# Patient Record
Sex: Male | Born: 1960 | Race: White | Hispanic: No | Smoking: Never smoker
Health system: Southern US, Community
[De-identification: ages and names within clinical notes are randomized; demographics above are authoritative.]

## PROBLEM LIST (undated history)

## (undated) DIAGNOSIS — K862 Cyst of pancreas: Secondary | ICD-10-CM

## (undated) DIAGNOSIS — E079 Disorder of thyroid, unspecified: Secondary | ICD-10-CM

## (undated) DIAGNOSIS — E7800 Pure hypercholesterolemia, unspecified: Secondary | ICD-10-CM

## (undated) DIAGNOSIS — K219 Gastro-esophageal reflux disease without esophagitis: Secondary | ICD-10-CM

## (undated) DIAGNOSIS — E039 Hypothyroidism, unspecified: Secondary | ICD-10-CM

## (undated) HISTORY — DX: Pure hypercholesterolemia, unspecified: E78.00

## (undated) HISTORY — PX: EYE SURGERY: SHX253

## (undated) HISTORY — DX: Disorder of thyroid, unspecified: E07.9

## (undated) HISTORY — PX: OTHER SURGICAL HISTORY: SHX169

---

## 2002-06-08 ENCOUNTER — Encounter: Payer: Self-pay | Admitting: Family Medicine

## 2002-06-08 ENCOUNTER — Encounter: Admission: RE | Admit: 2002-06-08 | Discharge: 2002-06-08 | Payer: Self-pay | Admitting: Family Medicine

## 2013-07-23 ENCOUNTER — Ambulatory Visit
Admission: RE | Admit: 2013-07-23 | Discharge: 2013-07-23 | Disposition: A | Discharge status: Home / Self Care | Payer: No Typology Code available for payment source | Source: Ambulatory Visit | Attending: Physician Assistant | Admitting: Physician Assistant

## 2013-07-23 ENCOUNTER — Encounter (INDEPENDENT_AMBULATORY_CARE_PROVIDER_SITE_OTHER): Payer: Self-pay

## 2013-07-23 ENCOUNTER — Other Ambulatory Visit: Payer: Self-pay | Admitting: Physician Assistant

## 2013-07-23 DIAGNOSIS — R059 Cough, unspecified: Secondary | ICD-10-CM

## 2013-07-23 DIAGNOSIS — R05 Cough: Secondary | ICD-10-CM

## 2013-07-28 ENCOUNTER — Other Ambulatory Visit: Payer: Self-pay | Admitting: Physician Assistant

## 2013-07-28 DIAGNOSIS — R911 Solitary pulmonary nodule: Secondary | ICD-10-CM

## 2013-07-29 ENCOUNTER — Ambulatory Visit
Admission: RE | Admit: 2013-07-29 | Discharge: 2013-07-29 | Disposition: A | Discharge status: Home / Self Care | Payer: No Typology Code available for payment source | Source: Ambulatory Visit | Attending: Physician Assistant | Admitting: Physician Assistant

## 2013-07-29 DIAGNOSIS — R911 Solitary pulmonary nodule: Secondary | ICD-10-CM

## 2013-07-29 MED ORDER — IOHEXOL 300 MG/ML  SOLN
75.0000 mL | Freq: Once | INTRAMUSCULAR | Status: AC | PRN
  Administered 2013-07-29: 75 mL via INTRAVENOUS

## 2013-08-02 ENCOUNTER — Other Ambulatory Visit: Payer: Self-pay | Admitting: Family Medicine

## 2013-08-02 DIAGNOSIS — K769 Liver disease, unspecified: Secondary | ICD-10-CM

## 2013-08-02 DIAGNOSIS — K869 Disease of pancreas, unspecified: Secondary | ICD-10-CM

## 2013-08-07 ENCOUNTER — Ambulatory Visit
Admission: RE | Admit: 2013-08-07 | Discharge: 2013-08-07 | Disposition: A | Discharge status: Home / Self Care | Payer: No Typology Code available for payment source | Source: Ambulatory Visit | Attending: Family Medicine | Admitting: Family Medicine

## 2013-08-07 DIAGNOSIS — K769 Liver disease, unspecified: Secondary | ICD-10-CM

## 2013-08-07 DIAGNOSIS — K869 Disease of pancreas, unspecified: Secondary | ICD-10-CM

## 2013-08-07 MED ORDER — GADOBENATE DIMEGLUMINE 529 MG/ML IV SOLN
20.0000 mL | Freq: Once | INTRAVENOUS | Status: AC | PRN
  Administered 2013-08-07: 20 mL via INTRAVENOUS

## 2013-08-25 ENCOUNTER — Encounter (HOSPITAL_COMMUNITY): Payer: Self-pay | Admitting: *Deleted

## 2013-08-25 ENCOUNTER — Encounter (HOSPITAL_COMMUNITY): Payer: Self-pay | Admitting: Pharmacy Technician

## 2013-08-30 ENCOUNTER — Other Ambulatory Visit: Payer: Self-pay | Admitting: Gastroenterology

## 2013-08-30 NOTE — Addendum Note (Signed)
Addended by: Chamille Werntz on: 08/30/2013 12:51 PM   Modules accepted: Orders  

## 2013-09-01 ENCOUNTER — Encounter (HOSPITAL_COMMUNITY): Payer: Self-pay | Admitting: *Deleted

## 2013-09-01 ENCOUNTER — Encounter (HOSPITAL_COMMUNITY)
Admission: RE | Disposition: A | Discharge status: Home / Self Care | Payer: Self-pay | Source: Ambulatory Visit | Attending: Gastroenterology

## 2013-09-01 ENCOUNTER — Ambulatory Visit (HOSPITAL_COMMUNITY): Payer: No Typology Code available for payment source | Admitting: Anesthesiology

## 2013-09-01 ENCOUNTER — Encounter (HOSPITAL_COMMUNITY): Payer: No Typology Code available for payment source | Admitting: Anesthesiology

## 2013-09-01 ENCOUNTER — Ambulatory Visit (HOSPITAL_COMMUNITY)
Admission: RE | Admit: 2013-09-01 | Discharge: 2013-09-01 | Disposition: A | Discharge status: Home / Self Care | Payer: No Typology Code available for payment source | Source: Ambulatory Visit | Attending: Gastroenterology | Admitting: Gastroenterology

## 2013-09-01 DIAGNOSIS — Z7982 Long term (current) use of aspirin: Secondary | ICD-10-CM | POA: Diagnosis not present

## 2013-09-01 DIAGNOSIS — E039 Hypothyroidism, unspecified: Secondary | ICD-10-CM | POA: Insufficient documentation

## 2013-09-01 DIAGNOSIS — Z79899 Other long term (current) drug therapy: Secondary | ICD-10-CM | POA: Insufficient documentation

## 2013-09-01 DIAGNOSIS — K863 Pseudocyst of pancreas: Principal | ICD-10-CM

## 2013-09-01 DIAGNOSIS — E7800 Pure hypercholesterolemia, unspecified: Secondary | ICD-10-CM | POA: Diagnosis not present

## 2013-09-01 DIAGNOSIS — K862 Cyst of pancreas: Secondary | ICD-10-CM | POA: Diagnosis present

## 2013-09-01 HISTORY — PX: EUS: SHX5427

## 2013-09-01 HISTORY — DX: Gastro-esophageal reflux disease without esophagitis: K21.9

## 2013-09-01 HISTORY — DX: Hypothyroidism, unspecified: E03.9

## 2013-09-01 HISTORY — PX: FINE NEEDLE ASPIRATION: SHX5430

## 2013-09-01 LAB — PANC CYST FLD ANLYS-PATHFNDR-TG

## 2013-09-01 SURGERY — ESOPHAGEAL ENDOSCOPIC ULTRASOUND (EUS) RADIAL
Anesthesia: Monitor Anesthesia Care

## 2013-09-01 MED ORDER — PROPOFOL 10 MG/ML IV BOLUS
INTRAVENOUS | Status: AC
  Filled 2013-09-01: qty 20

## 2013-09-01 MED ORDER — CIPROFLOXACIN HCL 500 MG PO TABS: 500.0000 mg | ORAL_TABLET | Freq: Two times a day (BID) | ORAL | Status: DC

## 2013-09-01 MED ORDER — LIDOCAINE HCL (CARDIAC) 20 MG/ML IV SOLN
INTRAVENOUS | Status: DC | PRN
  Administered 2013-09-01: 50 mg via INTRAVENOUS

## 2013-09-01 MED ORDER — SODIUM CHLORIDE 0.9 % IV SOLN: INTRAVENOUS | Status: DC

## 2013-09-01 MED ORDER — LIDOCAINE HCL (CARDIAC) 20 MG/ML IV SOLN
INTRAVENOUS | Status: AC
  Filled 2013-09-01: qty 5

## 2013-09-01 MED ORDER — CIPROFLOXACIN IN D5W 400 MG/200ML IV SOLN
400.0000 mg | Freq: Once | INTRAVENOUS | Status: AC
  Administered 2013-09-01: 400 mg via INTRAVENOUS

## 2013-09-01 MED ORDER — LACTATED RINGERS IV SOLN
INTRAVENOUS | Status: DC
  Administered 2013-09-01: 1000 mL via INTRAVENOUS

## 2013-09-01 MED ORDER — PROPOFOL INFUSION 10 MG/ML OPTIME
INTRAVENOUS | Status: DC | PRN
  Administered 2013-09-01: 120 ug/kg/min via INTRAVENOUS

## 2013-09-01 MED ORDER — CIPROFLOXACIN IN D5W 400 MG/200ML IV SOLN
INTRAVENOUS | Status: AC
  Filled 2013-09-01: qty 200

## 2013-09-01 MED ORDER — PROPOFOL 10 MG/ML IV BOLUS
INTRAVENOUS | Status: DC | PRN
  Administered 2013-09-01: 60 mg via INTRAVENOUS

## 2013-09-01 NOTE — Op Note (Signed)
[facility] [address] [place], [id number]   ENDOSCOPIC ULTRASOUND PROCEDURE REPORT  PATIENT: Tyler Smith, Tyler L.  MR#: [id number] BIRTHDATE: [birth year]  GENDER: Male ENDOSCOPIST: Willis ModenaWilliam Shacora Zynda, MD REFERRED BY:  Lupe Carneyean Mitchell, M.D. PROCEDURE DATE:  09/01/2013 PROCEDURE:   Upper EUS w/FNA ASA CLASS:      Class II INDICATIONS:   1.  pancreatic cyst. MEDICATIONS: MAC sedation, administered by CRNA, ciprofloxacin 400 mg IV  DESCRIPTION OF PROCEDURE:   After the risks benefits and alternatives of the procedure were  explained, informed consent was obtained. The patient was then placed in the left, lateral, decubitus postion and IV sedation was administered. Throughout the procedure, the patients blood pressure, pulse and oxygen saturations were monitored continuously.  Under direct visualization, the Pentax EUS Linear [id number]  endoscope was introduced through the mouth  and advanced to the second portion of the duodenum .  Water was used as necessary to provide an acoustic interface.  Upon completion of the imaging, water was removed and the patient was sent to the recovery room in satisfactory condition.    FINDINGS:      Normal head, neck, and body of pancreas.  Arising from anterior portion of the pancreatic tail, essentially in between the spleen and the left kidney, a 3 x 2 cm lesion was seen. It is round and well-defined.  It is mixed solid and cystic, but more solid than cystic.  No clear septations were seen.  No perilesional adenopathy was noted.  No mural nodularity was noted. No obvious communication with an otherwise normal-appearing and non-dilated pancreatic duct.  Lesion was biopsied x 5 (25g x 3, 22g x 3) and a separate pass was sent for cyst fluid analysis.  Bile duct non-dilated and otherwise normal.  Gallbladder appeared normal.  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 Differential considerations include mucinous cystic  neoplasm (side branch IPMN, mucinous cystadenoma) versus islet cell tumor. Overall imaging characteristics not typical of adenocarcinoma.  RECOMMENDATIONS:     1.  Watch for potential complications of procedure. 2.  Ciprofloxacin 500 mg po bid x 5 days. 3.  Awaiting cyst fluid and cytology results. 4.  Consider surgical consultation for consideration of distal pancreatectomy and splenectomy, pending cytology and cyst fluid results. 5.  Follow-up with [facility] on as-needed basis.   _______________________________ Rosalie DoctoreSignedWillis Modena:  Yuritzi Kamp, MD 09/01/2013 1:20 PM   CC:

## 2013-09-01 NOTE — Transfer of Care (Signed)
Immediate Anesthesia Transfer of Care Note  Patient: Tyler Smith  Procedure(s) Performed: Procedure(s): ESOPHAGEAL ENDOSCOPIC ULTRASOUND (EUS) RADIAL (N/A) FINE NEEDLE ASPIRATION (FNA) LINEAR (N/A)  Patient Location: PACU  Anesthesia Type:MAC  Level of Consciousness: awake, alert  and oriented  Airway & Oxygen Therapy: Patient Spontanous Breathing and Patient connected to nasal cannula oxygen  Post-op Assessment: Report given to PACU RN and Post -op Vital signs reviewed and stable  Post vital signs: Reviewed and stable  Complications: No apparent anesthesia complications

## 2013-09-01 NOTE — Anesthesia Preprocedure Evaluation (Addendum)
Anesthesia Evaluation  Patient identified by MRN, date of birth, ID band Patient awake    Reviewed: Allergy & Precautions, H&P , NPO status , Patient's Chart, lab work & pertinent test results  Airway Mallampati: II TM Distance: >3 FB Neck ROM: Full    Dental no notable dental hx.    Pulmonary neg pulmonary ROS,  breath sounds clear to auscultation  Pulmonary exam normal       Cardiovascular negative cardio ROS  Rhythm:Regular Rate:Normal     Neuro/Psych negative neurological ROS  negative psych ROS   GI/Hepatic negative GI ROS, Neg liver ROS,   Endo/Other  Hypothyroidism   Renal/GU negative Renal ROS  negative genitourinary   Musculoskeletal negative musculoskeletal ROS (+)   Abdominal   Peds negative pediatric ROS (+)  Hematology negative hematology ROS (+)   Anesthesia Other Findings   Reproductive/Obstetrics negative OB ROS                           Anesthesia Physical Anesthesia Plan  ASA: II  Anesthesia Plan: MAC   Post-op Pain Management:    Induction:   Airway Management Planned: Natural Airway  Additional Equipment:   Intra-op Plan:   Post-operative Plan:   Informed Consent: I have reviewed the patients History and Physical, chart, labs and discussed the procedure including the risks, benefits and alternatives for the proposed anesthesia with the patient or authorized representative who has indicated his/her understanding and acceptance.   Dental advisory given  Plan Discussed with: CRNA  Anesthesia Plan Comments:         Anesthesia Quick Evaluation

## 2013-09-01 NOTE — Discharge Instructions (Signed)
Endoscopic Ultrasound ° °Care After °Please read the instructions outlined below and refer to this sheet in the next few weeks. These discharge instructions provide you with general information on caring for yourself after you leave the hospital. Your doctor may also give you specific instructions. While your treatment has been planned according to the most current medical practices available, unavoidable complications occasionally occur. If you have any problems or questions after discharge, please call Dr. Jonathon Castelo ([facility]) at [phone]. ° °HOME CARE INSTRUCTIONS °Activity °· You may resume your regular activity but move at a slower pace for the next 24 hours.  °· Take frequent rest periods for the next 24 hours.  °· Walking will help expel (get rid of) the air and reduce the bloated feeling in your abdomen.  °· No driving for 24 hours (because of the anesthesia (medicine) used during the test).  °· You may shower.  °· Do not sign any important legal documents or operate any machinery for 24 hours (because of the anesthesia used during the test).  °Nutrition °· Drink plenty of fluids.  °· You may resume your normal diet.  °· Begin with a light meal and progress to your normal diet.  °· Avoid alcoholic beverages for 24 hours or as instructed by your caregiver.  °Medications °You may resume your normal medications unless your caregiver tells you otherwise. °What you can expect today °· You may experience abdominal discomfort such as a feeling of fullness or "gas" pains.  °· You may experience a sore throat for 2 to 3 days. This is normal. Gargling with salt water may help this.  °·  °SEEK IMMEDIATE MEDICAL CARE IF: °· You have excessive nausea (feeling sick to your stomach) and/or vomiting.  °· You have severe abdominal pain and distention (swelling).  °· You have trouble swallowing.  °· You have a temperature over 100° F (37.8° C).  °· You have rectal bleeding or vomiting of blood.  °Document  Released: 09/12/2003 Document Revised: 10/10/2010 Document Reviewed: 03/25/2007 °ExitCare® Patient Information ©2012 [employer]. °

## 2013-09-01 NOTE — H&P (Signed)
Patient interval history reviewed.  Patient examined again.  There has been no change from documented H/P dated 08/20/13 (scanned into chart from our office) except as documented above.  Assessment:  1.  Pancreatic tail cyst.  Plan:  1.  Upper endoscopic ultrasound with possible fine needle aspiration +/- cyst fluid aspiration. 2.  Risks (bleeding, infection, bowel perforation that could require surgery, sedation-related changes in cardiopulmonary systems), benefits (identification and possible treatment of source of symptoms, exclusion of certain causes of symptoms), and alternatives (watchful waiting, radiographic imaging studies, empiric medical treatment) of upper endoscopy with ultrasound and possible cyst aspiration +/- fine needle aspiration biopsies (EUS +/- FNA) were explained to patient/family in detail and patient wishes to proceed.

## 2013-09-02 ENCOUNTER — Encounter (HOSPITAL_COMMUNITY): Payer: Self-pay | Admitting: Gastroenterology

## 2013-09-02 NOTE — Anesthesia Postprocedure Evaluation (Signed)
  Anesthesia Post-op Note  Patient: Haig ProphetJamie L Mischke  Procedure(s) Performed: Procedure(s) (LRB): ESOPHAGEAL ENDOSCOPIC ULTRASOUND (EUS) RADIAL (N/A) FINE NEEDLE ASPIRATION (FNA) LINEAR (N/A)  Patient Location: PACU  Anesthesia Type: MAC  Level of Consciousness: awake and alert   Airway and Oxygen Therapy: Patient Spontanous Breathing  Post-op Pain: mild  Post-op Assessment: Post-op Vital signs reviewed, Patient's Cardiovascular Status Stable, Respiratory Function Stable, Patent Airway and No signs of Nausea or vomiting  Last Vitals:  Filed Vitals:   09/01/13 1330  BP: 152/101  Pulse: 75  Temp: 36.8 C  Resp: 26    Post-op Vital Signs: stable   Complications: No apparent anesthesia complications

## 2013-09-27 ENCOUNTER — Ambulatory Visit (INDEPENDENT_AMBULATORY_CARE_PROVIDER_SITE_OTHER): Payer: No Typology Code available for payment source | Admitting: General Surgery

## 2013-09-27 ENCOUNTER — Encounter (INDEPENDENT_AMBULATORY_CARE_PROVIDER_SITE_OTHER): Payer: Self-pay | Admitting: General Surgery

## 2013-09-27 VITALS — BP 130/76 | HR 77 | Temp 98.7°F | Ht 71.0 in | Wt 230.0 lb

## 2013-09-27 DIAGNOSIS — K862 Cyst of pancreas: Secondary | ICD-10-CM | POA: Insufficient documentation

## 2013-09-27 DIAGNOSIS — K863 Pseudocyst of pancreas: Secondary | ICD-10-CM

## 2013-09-27 NOTE — Progress Notes (Addendum)
Chief Complaint  Patient presents with  . pancreatitis cyst    Referring MD: Dr. Arta Silence  HISTORY:  She is a 53 year old male who had an incidentally discovered pancreatic cyst mass. He is referred by Dr. Paulita Fujita for consultation regarding this mass. He had a chronic cough and underwent a chest x-ray. The chest x-ray was nonrevealing and a chest CT was ordered. This chest CT demonstrated a 3 cm lesion in the distal pancreas.  The patient subsequently underwent an endoscopic ultrasound. The cytology demonstrated some mucinous fluid and atypical cells on the FNA.  The patient has no symptoms related to the pancreas that he is aware of. He does sometimes have some distention. He denies any issues with his blood glucose, diarrhea, jaundice, pancreatitis, family history of pancreatitis or nausea and vomiting.  Of note, his mother did have breast cancer and brain cancer. She passed away at age 44. He and his wife are not sure if the brain cancer was primary or metastatic. His chronic cough has resolved.  Past Medical History  Diagnosis Date  . Thyroid disease   . Hypercholesterolemia   . GERD (gastroesophageal reflux disease)     none recent  . Hypothyroidism     Past Surgical History  Procedure Laterality Date  . Eye surgery Bilateral age 74    lazy eye  . Pinkie finger surgfery Right yrs ago  . Eus N/A 09/01/2013    Procedure: ESOPHAGEAL ENDOSCOPIC ULTRASOUND (EUS) RADIAL;  Surgeon: Arta Silence, MD;  Location: [facility];  Service: Endoscopy;  Laterality: N/A;  . Fine needle aspiration N/A 09/01/2013    Procedure: FINE NEEDLE ASPIRATION (FNA) LINEAR;  Surgeon: Arta Silence, MD;  Location: [facility];  Service: Endoscopy;  Laterality: N/A;    Current Outpatient Prescriptions  Medication Sig Dispense Refill  . aspirin EC 81 MG tablet Take 81 mg by mouth daily.      Marland Kitchen levothyroxine (SYNTHROID, LEVOTHROID) 100 MCG tablet Take 100 mcg by mouth daily before breakfast.      .  pravastatin (PRAVACHOL) 40 MG tablet Take 40 mg by mouth daily.       No current facility-administered medications for this visit.     Allergies  Allergen Reactions  . Penicillins Hives     Family History  Problem Relation Age of Onset  . Cancer - Other Mother   . COPD Father   . Coronary artery disease Brother   . Coronary artery disease Brother      History   Social History  . Marital Status: Unknown    Spouse Name: N/A    Number of Children: N/A  . Years of Education: N/A   Social History Main Topics  . Smoking status: Never Smoker   . Smokeless tobacco: Never Used  . Alcohol Use: Yes     Comment: occasional  . Drug Use: No  . Sexual Activity: None   Other Topics Concern  . None   Social History Narrative  . None     REVIEW OF SYSTEMS - PERTINENT POSITIVES ONLY: 12 point review of systems negative other than HPI and PMH except for bloating.    EXAM: Filed Vitals:   09/27/13 1101  BP: 130/76  Pulse: 77  Temp: 98.7 F (37.1 C)    Wt Readings from Last 3 Encounters:  09/27/13 230 lb (104.327 kg)  09/01/13 230 lb (104.327 kg)  09/01/13 230 lb (104.327 kg)     Gen:  No acute distress.  Well nourished and well  groomed.   Neurological: Alert and oriented to person, place, and time. Coordination normal.  Head: Normocephalic and atraumatic.  Eyes: Conjunctivae are normal. Pupils are equal, round, and reactive to light. No scleral icterus.  Neck: Normal range of motion. Neck supple. No tracheal deviation or thyromegaly present.  Cardiovascular: Normal rate, regular rhythm, normal heart sounds and intact distal pulses.  Exam reveals no gallop and no friction rub.  No murmur heard. Respiratory: Effort normal.  No respiratory distress. No chest wall tenderness. Breath sounds normal.  No wheezes, rales or rhonchi.  GI: Soft. Bowel sounds are normal. The abdomen is soft and nontender.  There is no rebound and no guarding.  Musculoskeletal: Normal range of  motion. Extremities are nontender.  Lymphadenopathy: No cervical, preauricular, postauricular or axillary adenopathy is present Skin: Skin is warm and dry. No rash noted. No diaphoresis. No erythema. No pallor. No clubbing, cyanosis, or edema.   Psychiatric: Normal mood and affect. Behavior is normal. Judgment and thought content normal.    LABORATORY RESULTS: Available labs are reviewed  Cytology Diagnosis FINE NEEDLE ASPIRATION: NEEDLE ASPIRATION: NEEDLE ASPIRATION, ENDOSCOPIC, PANCREAS TAIL(SPECIMEN 1 OF 1 COLLECTED 09/01/13): ATYPICAL CELLS PRESENT, SUSPICIOUS FOR MUCIN PRODUCING CYSTIC NEOPLASM SEE COMMENT. COMMENT: THE CASE WAS REVIEWED BY DR Avis Epley, WHO CONCURS.  Recent Results (from the past 2160 hour(s))  PANC CYST FLD ANLYS-PATHFNDR-TG     Status: None   Collection Time    09/01/13 12:30 PM      Result Value Ref Range   Pathfinder TG, Pancreatic Cyst Fluid SPECIMEN SENT TO [facility]       RADIOLOGY RESULTS: See E-Chart or I-Site for most recent results.  Images and reports are reviewed.  CT chest IMPRESSION:  No evidence of pulmonary mass/nodule ; preceding radiographic  finding likely represented a summation artifact.  Cystic mass at tail of pancreas 3.0 x 2.4 x 2.6 cm.  Questionable low-attenuation lesion at posterior RIGHT lobe liver  2.0 x 1.4 x 1.5 cm.  Further evaluation of these findings by MR imaging with and without  contrast recommended.    MRI Abdomen IMPRESSION: 1. Complex cystic lesion arising from the pancreatic tail. This has indeterminate imaging characteristics. Considerations include a pseudocyst complicated by prior hemorrhage versus a cystic neoplasm such as intraductal papillary mucinous tumor or less likely islet cell tumor. If the patient is a good endoscopy candidate, endoscopic ultrasound with aspiration should be considered. If not, follow-up with pre and post contrast abdominal MRI is recommended at 3- 6 months. 2. Two  right liver lobe hemangiomas    ASSESSMENT AND PLAN: Cystic mass of pancreas This mass has some concerning features including the mucin, the complex cystic/solid components, and the size.  His mother did have breast cancer and possibly a second cancer.  If the mass in the pancreas is malignant, he is would need to be checked for BRCA.    We will plan surgical excision.  I would plan on removing the distal pancreas robotically or laparoscopically.  I would assess the mass intraoperatively.  If it is extremely firm, I would send it for frozen section and take the spleen if malignant.      I discussed the surgery with diagrams of anatomy.  I reviewed the anticipated hospital stay, recovery time, and restrictions.  I discussed the most common complications including the ones below:  Bleeding Infection and possible wound complications such as hernia Damage to adjacent structures Leak of the pancreas (5-10%) Possible need for other procedures, such  as abscess drains in radiology or endoscopy.   Possible prolonged hospital stay Possible development of diabetes or worsening of current diabetes. (5-20%) Possible diarrhea from lack of pancreatic enzymes.   Prolonged fatigue/weakness/appetite MOST PATIENTS' ENERGY LEVEL IS NOT BACK TO NORMAL FOR AT LEAST 2-3 MONTHS.  OLDER PATIENTS MAY FEEL WEAK FOR LONGER PERIODS OF TIME.   Possible early recurrence of cancer Possible complications of your medical problems such as heart disease or arrhythmias. Death (less than 1%)  He understands and wishes to proceed.     40 min spent in evaluation, examination, counseling, and coordination of care.  >50% of time in counseling.     Milus Height MD Surgical Oncology, [facility]   ADDENDUM - PANCREATIC CYST FLUID CEA 56, 940 AMYLASE 3,574   Visit Diagnoses: 1. Cystic mass of pancreas     Primary Care Physician: Donnie Coffin, MD  Other care  team Arta Silence, MD

## 2013-09-27 NOTE — Assessment & Plan Note (Signed)
This mass has some concerning features including the mucin, the complex cystic/solid components, and the size.  His mother did have breast cancer and possibly a second cancer.  If the mass in the pancreas is malignant, he is would need to be checked for BRCA.    We will plan surgical excision.  I would plan on removing the distal pancreas robotically or laparoscopically.  I would assess the mass intraoperatively.  If it is extremely firm, I would send it for frozen section and take the spleen if malignant.      I discussed the surgery with diagrams of anatomy.  I reviewed the anticipated hospital stay, recovery time, and restrictions.  I discussed the most common complications including the ones below:  Bleeding Infection and possible wound complications such as hernia Damage to adjacent structures Leak of the pancreas (5-10%) Possible need for other procedures, such as abscess drains in radiology or endoscopy.   Possible prolonged hospital stay Possible development of diabetes or worsening of current diabetes. (5-20%) Possible diarrhea from lack of pancreatic enzymes.   Prolonged fatigue/weakness/appetite MOST PATIENTS' ENERGY LEVEL IS NOT BACK TO NORMAL FOR AT LEAST 2-3 MONTHS.  OLDER PATIENTS MAY FEEL WEAK FOR LONGER PERIODS OF TIME.   Possible early recurrence of cancer Possible complications of your medical problems such as heart disease or arrhythmias. Death (less than 1%)  He understands and wishes to proceed.

## 2013-09-27 NOTE — Patient Instructions (Signed)
Pancreas Surgery  THE OPERATION: The goal of the operation is to remove masses in the tail of the pancreas.  The distal pancreas and possibly the spleen will be removed.  The reason so much may need to be removed is that the blood supply and lymphatic channels and nodes are closely interconnected.  The operation takes between 2-4 hours depending on the amount of scar tissue you have present from prior surgeries, or from the mass.    WHAT HAPPENS IN THE OPERATING ROOM:  You will have 3-5 incisions in your abdomen You may have an incision in the midline around 3-4 inches for a hand port, and several other incisions in the left upper abdomen.  Whatever the operative findings, I will discuss the case with your family after we are done in the operating room.  I will talk to you in the next few days when you are more awake.  I will see you in the hospital every weekday that I am not out of town.  My partners help see patients on the weekends and if I am out of town.    WHAT HAPPENS AFTER SURGERY: After surgery, you will go first to the recovery room,then to an appropriate level of room.  You will have many tubes and lines in place which is standard for this operation.   You will have a tube in your nose for 1-2 days in order to suction out the stomach.  YOU WILL NOT BE ABLE TO EAT FOR SEVERAL DAYS AFTER SURGERY.  You will have a catheter in your bladder.  On your abdomen, you will have a surgical drain and possibly a pain pump with numbing medicine.  You will have compression stockings on your legs to decrease the risk of blood clots.    We will address your pain in several ways.  We will use an IV pain pump called a "PCA," or Patient Controlled Analgesia.  This allows you to press a button and immediately receive a dose of pain medication without waiting for a nurse.  We also use IV Tylenol and sometimes IV Toradol which is similar to ibuprofen.  You may also have a pump with numbing medicine delivered  directly to your incision.  I use doses and medications that work for the majority of people, but you may need an adjustment to the dose or type of medicine if your pain is not adequately controlled.  Your throat may be sore, in which case you may need a throat spray or lozenges.    We will ask you to get out of bed the day after surgery in order to maximize your chances of not having complications.  Your risk of pneumonia and blood clots is lower with walking and sitting in the chair.  We will also ask you to perform breathing exercises.  We will also ask to you walk in your room and in the halls for the above reasons, but also in order for you to keep up your strength.    EATING: We will usually start you on clear liquids in around 1-2 days if your bowel function seems to have returned.  We advance your diet slowly to make sure you are tolerating each step.  All patients do not have a normal appetite when they go home Most patients also find that their taste buds do not seem the same right after surgery, and this can continue into the time of possible post operative chemotherapy and radiation.  Some patients  develop diabetes and will need assistance from a primary care doctor for medication.    OTHER TREATMENT? If we find a cancer, I will present your case at our GI Multidisciplinary conference which occurs every 1-2 weeks.  Medical and Radiation Oncologists are present, as well as the pathologist and radiologist in addition to myself.  If you have a larger tumor or if you have positive lymph nodes, we may have the oncologist stop by to see you while you are in the hospital.  Most firm post op treatment plans occur, however, once you are an outpatient because we will need to see how you are recovering from surgery.  GOING HOME! Usually you are able to go home in 3-7 days, depending on whether or not complications happen and what is going on with your overall health status.   If you have more health  problems or if you have limited help at home, the therapists and nurses may recommend a temporary rehab or nursing facility to help you get back on your feet before you go home.  These decisions would be made while you are in the hospital with the assistance of a social worker or case manager.    Please bring all insurance/disability forms to our office for the staff to fill out   POSSIBLE COMPLICATIONS This is a very extensive operation and includes complications listed below: Bleeding Infection and possible wound complications such as hernia Damage to adjacent structures Leak of the pancreas (5%) Possible need for other procedures, such as abscess drains in radiology or endoscopy.   Possible prolonged hospital stay Possible development of diabetes or worsening of current diabetes. (5-10%) Possible diarrhea from lack of pancreatic enzymes.   Prolonged fatigue/weakness/appetite MOST PATIENTS' ENERGY LEVEL IS NOT BACK TO NORMAL FOR AT LEAST 6-8 weeks.  OLDER PATIENTS MAY FEEL WEAK FOR LONGER PERIODS OF TIME.   Possible complications of your medical problems such as heart disease or arrhythmias. Death (less than 1%)  All possible complications are not listed, just the most common.    FURTHER INFORMATION? Please ask questions if you find something that we did not discuss in the office and would like more information.  If you would like another appointment if you have many questions or if your family members would like to come as well, please contact the office.    IF YOU ARE TAKING ASPIRIN, PLAVIX, COUMADIN, OR OTHER BLOOD THINNERS, LET US KNOW IMMEDIATELY SO WE CAN CONTACT YOUR PRESCRIBING HEALTH CARE PROVIDER TO HOLD THE MEDICATION FOR 5-7 DAYS BEFORE SURGERY

## 2013-10-07 ENCOUNTER — Encounter (INDEPENDENT_AMBULATORY_CARE_PROVIDER_SITE_OTHER): Payer: Self-pay

## 2013-10-28 ENCOUNTER — Encounter (HOSPITAL_COMMUNITY): Payer: Self-pay | Admitting: Pharmacy Technician

## 2013-10-28 ENCOUNTER — Other Ambulatory Visit: Payer: Self-pay | Admitting: Gastroenterology

## 2013-11-05 ENCOUNTER — Encounter (HOSPITAL_COMMUNITY): Payer: Self-pay

## 2013-11-05 ENCOUNTER — Encounter (HOSPITAL_COMMUNITY)
Admission: RE | Admit: 2013-11-05 | Discharge: 2013-11-05 | Disposition: A | Discharge status: Home / Self Care | Payer: No Typology Code available for payment source | Source: Ambulatory Visit | Attending: General Surgery | Admitting: General Surgery

## 2013-11-05 DIAGNOSIS — Z01812 Encounter for preprocedural laboratory examination: Secondary | ICD-10-CM | POA: Diagnosis present

## 2013-11-05 DIAGNOSIS — K862 Cyst of pancreas: Secondary | ICD-10-CM | POA: Diagnosis not present

## 2013-11-05 DIAGNOSIS — K863 Pseudocyst of pancreas: Secondary | ICD-10-CM | POA: Diagnosis present

## 2013-11-05 LAB — CBC WITH DIFFERENTIAL/PLATELET
Basophils Absolute: 0.1 10*3/uL (ref 0.0–0.1)
Basophils Relative: 1 % (ref 0–1)
EOS ABS: 0.3 10*3/uL (ref 0.0–0.7)
EOS PCT: 3 % (ref 0–5)
HEMATOCRIT: 47.6 % (ref 39.0–52.0)
HEMOGLOBIN: 16.3 g/dL (ref 13.0–17.0)
LYMPHS ABS: 1.8 10*3/uL (ref 0.7–4.0)
Lymphocytes Relative: 22 % (ref 12–46)
MCH: 29.5 pg (ref 26.0–34.0)
MCHC: 34.2 g/dL (ref 30.0–36.0)
MCV: 86.1 fL (ref 78.0–100.0)
MONO ABS: 1 10*3/uL (ref 0.1–1.0)
MONOS PCT: 11 % (ref 3–12)
Neutro Abs: 5.3 10*3/uL (ref 1.7–7.7)
Neutrophils Relative %: 63 % (ref 43–77)
Platelets: 342 10*3/uL (ref 150–400)
RBC: 5.53 MIL/uL (ref 4.22–5.81)
RDW: 12.3 % (ref 11.5–15.5)
WBC: 8.4 10*3/uL (ref 4.0–10.5)

## 2013-11-05 LAB — URINALYSIS, ROUTINE W REFLEX MICROSCOPIC
Bilirubin Urine: NEGATIVE
Glucose, UA: NEGATIVE mg/dL
HGB URINE DIPSTICK: NEGATIVE
Ketones, ur: NEGATIVE mg/dL
LEUKOCYTES UA: NEGATIVE
Nitrite: NEGATIVE
Protein, ur: NEGATIVE mg/dL
Specific Gravity, Urine: 1.012 (ref 1.005–1.030)
UROBILINOGEN UA: 0.2 mg/dL (ref 0.0–1.0)
pH: 7 (ref 5.0–8.0)

## 2013-11-05 LAB — COMPREHENSIVE METABOLIC PANEL
ALK PHOS: 75 U/L (ref 39–117)
ALT: 32 U/L (ref 0–53)
ANION GAP: 14 (ref 5–15)
AST: 27 U/L (ref 0–37)
Albumin: 4.2 g/dL (ref 3.5–5.2)
BUN: 8 mg/dL (ref 6–23)
CHLORIDE: 101 meq/L (ref 96–112)
CO2: 25 meq/L (ref 19–32)
Calcium: 9.5 mg/dL (ref 8.4–10.5)
Creatinine, Ser: 0.92 mg/dL (ref 0.50–1.35)
GLUCOSE: 97 mg/dL (ref 70–99)
POTASSIUM: 3.9 meq/L (ref 3.7–5.3)
Sodium: 140 mEq/L (ref 137–147)
Total Bilirubin: 0.4 mg/dL (ref 0.3–1.2)
Total Protein: 8.1 g/dL (ref 6.0–8.3)

## 2013-11-05 LAB — ABO/RH: ABO/RH(D): O POS

## 2013-11-05 LAB — PROTIME-INR
INR: 1.02 (ref 0.00–1.49)
Prothrombin Time: 13.4 seconds (ref 11.6–15.2)

## 2013-11-05 NOTE — Pre-Procedure Instructions (Signed)
11-05-13 CXR 6'15 Epic.

## 2013-11-05 NOTE — Patient Instructions (Addendum)
20 Tyler Smith  11/05/2013   Your procedure is scheduled on: 9-26  -2015 Tuesday  Enter through [facility] Entrance and follow signs to [facility]. Arrive at     0730   AM   Call this number if you have problems the morning of surgery: [phone]  Or Presurgical Testing [phone].   For Living Will and/or Health Care Power Attorney Forms: please provide copy for your medical record,may bring AM of surgery(Forms should be already notarized -we do not provide this service).( 11-05-13  No information preferred today).  Remember: Follow any bowel prep instructions per MD office.    Do not eat food/ or drink: After Midnight.      Take these medicines the morning of surgery with A SIP OF WATER: Levothyroxine.   Do not wear jewelry, make-up or nail polish.  Do not wear lotions, powders, or perfumes. You may not  wear deodorant.  Do not shave 48 hours(2 days) prior to first CHG shower(legs and under arms).(Shaving face and neck okay.)  Do not bring valuables to the hospital.(Hospital is not responsible for lost valuables).  Contacts, dentures or removable bridgework, body piercing, hair pins may not be worn into surgery.  Leave suitcase in the car. After surgery it may be brought to your room.  For patients admitted to the hospital, checkout time is 11:00 AM the day of discharge.(Restricted visitors-Any Persons displaying flu-like symptoms or illness).    Patients discharged the day of surgery will not be allowed to drive home. Must have responsible person with you x 24 hours once discharged.  Name and phone number of your driver: Verline Lema [phone] cell  Special Instructions: CHG(Chlorhedine 4%-"Hibiclens","Betasept","Aplicare") Shower Use Special Wash: see special instructions.(avoid face and genitals)   Please read over the following fact sheets that you were given: Blood Transfusion fact sheet.  Remember : Type/Screen "Blue armbands" - may not be removed once  applied(would result in being retested AM of surgery, if removed).     ______________________________    [facility] - Preparing for Surgery Before surgery, you can play an important role.  Because skin is not sterile, your skin needs to be as free of germs as possible.  You can reduce the number of germs on your skin by washing with CHG (chlorahexidine gluconate) soap before surgery.  CHG is an antiseptic cleaner which kills germs and bonds with the skin to continue killing germs even after washing. Please DO NOT use if you have an allergy to CHG or antibacterial soaps.  If your skin becomes reddened/irritated stop using the CHG and inform your nurse when you arrive at Short Stay. Do not shave (including legs and underarms) for at least 48 hours prior to the first CHG shower.  You may shave your face/neck. Please follow these instructions carefully:  1.  Shower with CHG Soap the night before surgery and the  morning of Surgery.  2.  If you choose to wash your hair, wash your hair first as usual with your  normal  shampoo.  3.  After you shampoo, rinse your hair and body thoroughly to remove the  shampoo.                           4.  Use CHG as you would any other liquid soap.  You can apply chg directly  to the skin and wash  Gently with a scrungie or clean washcloth.  5.  Apply the CHG Soap to your body ONLY FROM THE NECK DOWN.   Do not use on face/ open                           Wound or open sores. Avoid contact with eyes, ears mouth and genitals (private parts).                       Wash face,  Genitals (private parts) with your normal soap.             6.  Wash thoroughly, paying special attention to the area where your surgery  will be performed.  7.  Thoroughly rinse your body with warm water from the neck down.  8.  DO NOT shower/wash with your normal soap after using and rinsing off  the CHG Soap.                9.  Pat yourself dry with a clean towel.             10.  Wear clean pajamas.            11.  Place clean sheets on your bed the night of your first shower and do not  sleep with pets. Day of Surgery : Do not apply any lotions/deodorants the morning of surgery.  Please wear clean clothes to the hospital/surgery center.  FAILURE TO FOLLOW THESE INSTRUCTIONS MAY RESULT IN THE CANCELLATION OF YOUR SURGERY PATIENT SIGNATURE_________________________________  NURSE SIGNATURE__________________________________  ________________________________________________________________________

## 2013-11-09 ENCOUNTER — Encounter (HOSPITAL_COMMUNITY)
Admission: RE | Disposition: A | Discharge status: Home / Self Care | Payer: Self-pay | Source: Home / Self Care | Attending: General Surgery

## 2013-11-09 ENCOUNTER — Inpatient Hospital Stay (HOSPITAL_COMMUNITY)
Admission: RE | Admit: 2013-11-09 | Discharge: 2013-11-15 | DRG: 406 | Disposition: A | Discharge status: Home / Self Care | Payer: No Typology Code available for payment source | Attending: Surgery | Admitting: Surgery

## 2013-11-09 ENCOUNTER — Encounter (HOSPITAL_COMMUNITY): Payer: Self-pay | Admitting: *Deleted

## 2013-11-09 ENCOUNTER — Encounter (HOSPITAL_COMMUNITY): Payer: No Typology Code available for payment source | Admitting: Certified Registered Nurse Anesthetist

## 2013-11-09 ENCOUNTER — Inpatient Hospital Stay (HOSPITAL_COMMUNITY): Payer: No Typology Code available for payment source | Admitting: Certified Registered Nurse Anesthetist

## 2013-11-09 DIAGNOSIS — E079 Disorder of thyroid, unspecified: Secondary | ICD-10-CM | POA: Diagnosis present

## 2013-11-09 DIAGNOSIS — Z8249 Family history of ischemic heart disease and other diseases of the circulatory system: Secondary | ICD-10-CM | POA: Diagnosis not present

## 2013-11-09 DIAGNOSIS — Z7982 Long term (current) use of aspirin: Secondary | ICD-10-CM

## 2013-11-09 DIAGNOSIS — Z880 Allergy status to penicillin: Secondary | ICD-10-CM

## 2013-11-09 DIAGNOSIS — Z808 Family history of malignant neoplasm of other organs or systems: Secondary | ICD-10-CM

## 2013-11-09 DIAGNOSIS — K862 Cyst of pancreas: Principal | ICD-10-CM | POA: Diagnosis present

## 2013-11-09 DIAGNOSIS — K219 Gastro-esophageal reflux disease without esophagitis: Secondary | ICD-10-CM | POA: Diagnosis present

## 2013-11-09 DIAGNOSIS — K316 Fistula of stomach and duodenum: Secondary | ICD-10-CM | POA: Diagnosis present

## 2013-11-09 DIAGNOSIS — Z825 Family history of asthma and other chronic lower respiratory diseases: Secondary | ICD-10-CM | POA: Diagnosis not present

## 2013-11-09 DIAGNOSIS — E780 Pure hypercholesterolemia: Secondary | ICD-10-CM | POA: Diagnosis present

## 2013-11-09 DIAGNOSIS — Z79899 Other long term (current) drug therapy: Secondary | ICD-10-CM

## 2013-11-09 DIAGNOSIS — K869 Disease of pancreas, unspecified: Secondary | ICD-10-CM | POA: Diagnosis present

## 2013-11-09 DIAGNOSIS — E039 Hypothyroidism, unspecified: Secondary | ICD-10-CM | POA: Diagnosis present

## 2013-11-09 DIAGNOSIS — K567 Ileus, unspecified: Secondary | ICD-10-CM | POA: Diagnosis not present

## 2013-11-09 DIAGNOSIS — Z803 Family history of malignant neoplasm of breast: Secondary | ICD-10-CM | POA: Diagnosis not present

## 2013-11-09 DIAGNOSIS — K868 Other specified diseases of pancreas: Secondary | ICD-10-CM | POA: Diagnosis present

## 2013-11-09 LAB — CREATININE, SERUM
Creatinine, Ser: 0.83 mg/dL (ref 0.50–1.35)
GFR calc non Af Amer: >90 mL/min (ref >90)

## 2013-11-09 LAB — CBC
HCT: 42.8 % (ref 39.0–52.0)
Hemoglobin: 14.4 g/dL (ref 13.0–17.0)
MCH: 29.1 pg (ref 26.0–34.0)
MCHC: 33.6 g/dL (ref 30.0–36.0)
MCV: 86.5 fL (ref 78.0–100.0)
PLATELETS: 243 10*3/uL (ref 150–400)
RBC: 4.95 MIL/uL (ref 4.22–5.81)
RDW: 12.5 % (ref 11.5–15.5)
WBC: 15.1 10*3/uL — AB (ref 4.0–10.5)

## 2013-11-09 LAB — TYPE AND SCREEN
ABO/RH(D): O POS
Antibody Screen: NEGATIVE

## 2013-11-09 LAB — GLUCOSE, CAPILLARY: Glucose-Capillary: 176 mg/dL — ABNORMAL HIGH (ref 70–99)

## 2013-11-09 SURGERY — COLECTOMY, PARTIAL, ROBOT-ASSISTED, LAPAROSCOPIC
Anesthesia: General

## 2013-11-09 MED ORDER — SUCCINYLCHOLINE CHLORIDE 20 MG/ML IJ SOLN
INTRAMUSCULAR | Status: DC | PRN
  Administered 2013-11-09: 100 mg via INTRAVENOUS

## 2013-11-09 MED ORDER — ONDANSETRON HCL 4 MG/2ML IJ SOLN
INTRAMUSCULAR | Status: DC | PRN
  Administered 2013-11-09: 4 mg via INTRAVENOUS

## 2013-11-09 MED ORDER — HYDROMORPHONE HCL 1 MG/ML IJ SOLN
INTRAMUSCULAR | Status: AC
  Filled 2013-11-09: qty 1

## 2013-11-09 MED ORDER — ROCURONIUM BROMIDE 100 MG/10ML IV SOLN
INTRAVENOUS | Status: AC
Start: 2013-11-09 — End: 2013-11-09
  Filled 2013-11-09: qty 1

## 2013-11-09 MED ORDER — PROPOFOL 10 MG/ML IV BOLUS
INTRAVENOUS | Status: AC
Start: 2013-11-09 — End: 2013-11-09
  Filled 2013-11-09: qty 20

## 2013-11-09 MED ORDER — KCL IN DEXTROSE-NACL 20-5-0.45 MEQ/L-%-% IV SOLN
INTRAVENOUS | Status: DC
  Administered 2013-11-09 – 2013-11-12 (×7): via INTRAVENOUS
  Administered 2013-11-13: 50 mL/h via INTRAVENOUS
  Administered 2013-11-13: 07:00:00 via INTRAVENOUS
  Filled 2013-11-09 (×13): qty 1000

## 2013-11-09 MED ORDER — LIDOCAINE HCL (PF) 1 % IJ SOLN
INTRAMUSCULAR | Status: DC | PRN
  Administered 2013-11-09: 20 mL

## 2013-11-09 MED ORDER — MORPHINE SULFATE 2 MG/ML IJ SOLN
1.0000 mg | INTRAMUSCULAR | Status: DC | PRN
  Administered 2013-11-09: 2 mg via INTRAVENOUS
  Administered 2013-11-09 – 2013-11-10 (×2): 4 mg via INTRAVENOUS
  Administered 2013-11-10: 2 mg via INTRAVENOUS
  Administered 2013-11-10: 4 mg via INTRAVENOUS
  Administered 2013-11-10: 2 mg via INTRAVENOUS
  Administered 2013-11-10: 4 mg via INTRAVENOUS
  Administered 2013-11-11 – 2013-11-12 (×4): 2 mg via INTRAVENOUS
  Filled 2013-11-09: qty 1
  Filled 2013-11-09 (×3): qty 2
  Filled 2013-11-09: qty 1
  Filled 2013-11-09 (×2): qty 2
  Filled 2013-11-09 (×4): qty 1

## 2013-11-09 MED ORDER — LACTATED RINGERS IV SOLN
INTRAVENOUS | Status: DC | PRN
  Administered 2013-11-09 (×4): via INTRAVENOUS

## 2013-11-09 MED ORDER — ROCURONIUM BROMIDE 100 MG/10ML IV SOLN
INTRAVENOUS | Status: DC | PRN
  Administered 2013-11-09 (×2): 10 mg via INTRAVENOUS
  Administered 2013-11-09: 50 mg via INTRAVENOUS
  Administered 2013-11-09 (×6): 10 mg via INTRAVENOUS
  Administered 2013-11-09: 20 mg via INTRAVENOUS

## 2013-11-09 MED ORDER — PROPOFOL 10 MG/ML IV BOLUS
INTRAVENOUS | Status: DC | PRN
  Administered 2013-11-09: 200 mg via INTRAVENOUS

## 2013-11-09 MED ORDER — MIDAZOLAM HCL 2 MG/2ML IJ SOLN
INTRAMUSCULAR | Status: AC
  Filled 2013-11-09: qty 2

## 2013-11-09 MED ORDER — CIPROFLOXACIN IN D5W 400 MG/200ML IV SOLN
400.0000 mg | Freq: Two times a day (BID) | INTRAVENOUS | Status: AC
  Administered 2013-11-09: 400 mg via INTRAVENOUS
  Filled 2013-11-09: qty 200

## 2013-11-09 MED ORDER — MIDAZOLAM HCL 5 MG/5ML IJ SOLN
INTRAMUSCULAR | Status: DC | PRN
  Administered 2013-11-09: 2 mg via INTRAVENOUS

## 2013-11-09 MED ORDER — FENTANYL CITRATE 0.05 MG/ML IJ SOLN
INTRAMUSCULAR | Status: AC
  Filled 2013-11-09: qty 2

## 2013-11-09 MED ORDER — NEOSTIGMINE METHYLSULFATE 10 MG/10ML IV SOLN
INTRAVENOUS | Status: DC | PRN
  Administered 2013-11-09: 5 mg via INTRAVENOUS

## 2013-11-09 MED ORDER — NEOSTIGMINE METHYLSULFATE 10 MG/10ML IV SOLN
INTRAVENOUS | Status: AC
  Filled 2013-11-09: qty 1

## 2013-11-09 MED ORDER — OXYCODONE HCL 5 MG/5ML PO SOLN
5.0000 mg | Freq: Once | ORAL | Status: DC | PRN
  Filled 2013-11-09: qty 5

## 2013-11-09 MED ORDER — HYDROMORPHONE HCL 1 MG/ML IJ SOLN
0.2500 mg | INTRAMUSCULAR | Status: DC | PRN
  Administered 2013-11-09 (×2): 0.5 mg via INTRAVENOUS

## 2013-11-09 MED ORDER — MEPERIDINE HCL 50 MG/ML IJ SOLN: 6.2500 mg | INTRAMUSCULAR | Status: DC | PRN

## 2013-11-09 MED ORDER — KETOROLAC TROMETHAMINE 15 MG/ML IJ SOLN
INTRAMUSCULAR | Status: AC
  Filled 2013-11-09: qty 1

## 2013-11-09 MED ORDER — CIPROFLOXACIN IN D5W 400 MG/200ML IV SOLN
INTRAVENOUS | Status: AC
  Filled 2013-11-09: qty 200

## 2013-11-09 MED ORDER — ROCURONIUM BROMIDE 100 MG/10ML IV SOLN
INTRAVENOUS | Status: AC
  Filled 2013-11-09: qty 1

## 2013-11-09 MED ORDER — DEXAMETHASONE SODIUM PHOSPHATE 10 MG/ML IJ SOLN
INTRAMUSCULAR | Status: DC | PRN
  Administered 2013-11-09: 10 mg via INTRAVENOUS

## 2013-11-09 MED ORDER — GLYCOPYRROLATE 0.2 MG/ML IJ SOLN
INTRAMUSCULAR | Status: DC | PRN
  Administered 2013-11-09: 0.6 mg via INTRAVENOUS

## 2013-11-09 MED ORDER — PHENYLEPHRINE HCL 10 MG/ML IJ SOLN
INTRAMUSCULAR | Status: DC | PRN
Start: 2013-11-09 — End: 2013-11-09
  Administered 2013-11-09 (×2): 40 ug via INTRAVENOUS
  Administered 2013-11-09 (×3): 80 ug via INTRAVENOUS

## 2013-11-09 MED ORDER — HYDROMORPHONE HCL 1 MG/ML IJ SOLN
INTRAMUSCULAR | Status: DC | PRN
  Administered 2013-11-09 (×8): 0.5 mg via INTRAVENOUS

## 2013-11-09 MED ORDER — EPHEDRINE SULFATE 50 MG/ML IJ SOLN
INTRAMUSCULAR | Status: DC | PRN
  Administered 2013-11-09: 10 mg via INTRAVENOUS

## 2013-11-09 MED ORDER — KETOROLAC TROMETHAMINE 15 MG/ML IJ SOLN
15.0000 mg | Freq: Four times a day (QID) | INTRAMUSCULAR | Status: AC
  Administered 2013-11-09: 15 mg via INTRAVENOUS

## 2013-11-09 MED ORDER — BUPIVACAINE-EPINEPHRINE 0.25% -1:200000 IJ SOLN
INTRAMUSCULAR | Status: DC | PRN
  Administered 2013-11-09: 30 mL

## 2013-11-09 MED ORDER — TISSEEL VH 10 ML EX KIT
PACK | CUTANEOUS | Status: DC | PRN
  Administered 2013-11-09: 20 mL

## 2013-11-09 MED ORDER — LIDOCAINE HCL 1 % IJ SOLN
INTRAMUSCULAR | Status: AC
  Filled 2013-11-09: qty 20

## 2013-11-09 MED ORDER — ONDANSETRON HCL 4 MG/2ML IJ SOLN
INTRAMUSCULAR | Status: AC
  Filled 2013-11-09: qty 2

## 2013-11-09 MED ORDER — DEXAMETHASONE SODIUM PHOSPHATE 10 MG/ML IJ SOLN
INTRAMUSCULAR | Status: AC
  Filled 2013-11-09: qty 1

## 2013-11-09 MED ORDER — GLYCOPYRROLATE 0.2 MG/ML IJ SOLN
INTRAMUSCULAR | Status: AC
  Filled 2013-11-09: qty 3

## 2013-11-09 MED ORDER — OXYCODONE HCL 5 MG PO TABS: 5.0000 mg | ORAL_TABLET | Freq: Once | ORAL | Status: DC | PRN

## 2013-11-09 MED ORDER — PROMETHAZINE HCL 25 MG/ML IJ SOLN: 6.2500 mg | INTRAMUSCULAR | Status: DC | PRN

## 2013-11-09 MED ORDER — CIPROFLOXACIN IN D5W 400 MG/200ML IV SOLN
400.0000 mg | INTRAVENOUS | Status: AC
  Administered 2013-11-09: 400 mg via INTRAVENOUS

## 2013-11-09 MED ORDER — BUPIVACAINE-EPINEPHRINE (PF) 0.25% -1:200000 IJ SOLN
INTRAMUSCULAR | Status: AC
Start: 2013-11-09 — End: 2013-11-09
  Filled 2013-11-09: qty 30

## 2013-11-09 MED ORDER — HYDROMORPHONE HCL 2 MG/ML IJ SOLN
INTRAMUSCULAR | Status: AC
  Filled 2013-11-09: qty 1

## 2013-11-09 MED ORDER — LACTATED RINGERS IV SOLN
INTRAVENOUS | Status: DC | PRN
  Administered 2013-11-09 (×3): via INTRAVENOUS

## 2013-11-09 MED ORDER — LIDOCAINE HCL (CARDIAC) 20 MG/ML IV SOLN
INTRAVENOUS | Status: AC
  Filled 2013-11-09: qty 5

## 2013-11-09 MED ORDER — FENTANYL CITRATE 0.05 MG/ML IJ SOLN
INTRAMUSCULAR | Status: DC | PRN
  Administered 2013-11-09 (×7): 50 ug via INTRAVENOUS

## 2013-11-09 MED ORDER — HEPARIN SODIUM (PORCINE) 5000 UNIT/ML IJ SOLN
5000.0000 [IU] | Freq: Three times a day (TID) | INTRAMUSCULAR | Status: DC
  Filled 2013-11-09 (×5): qty 1

## 2013-11-09 MED ORDER — LIDOCAINE HCL (CARDIAC) 20 MG/ML IV SOLN
INTRAVENOUS | Status: DC | PRN
  Administered 2013-11-09: 100 mg via INTRAVENOUS

## 2013-11-09 MED ORDER — FENTANYL CITRATE 0.05 MG/ML IJ SOLN
INTRAMUSCULAR | Status: AC
  Filled 2013-11-09: qty 5

## 2013-11-09 MED ORDER — PHENYLEPHRINE 40 MCG/ML (10ML) SYRINGE FOR IV PUSH (FOR BLOOD PRESSURE SUPPORT)
PREFILLED_SYRINGE | INTRAVENOUS | Status: AC
  Filled 2013-11-09: qty 10

## 2013-11-09 MED ORDER — KETOROLAC TROMETHAMINE 15 MG/ML IJ SOLN
15.0000 mg | Freq: Four times a day (QID) | INTRAMUSCULAR | Status: DC | PRN
  Administered 2013-11-10 – 2013-11-13 (×11): 15 mg via INTRAVENOUS
  Filled 2013-11-09 (×11): qty 1

## 2013-11-09 MED ORDER — ONDANSETRON HCL 4 MG/2ML IJ SOLN
4.0000 mg | Freq: Four times a day (QID) | INTRAMUSCULAR | Status: DC | PRN
Start: 2013-11-09 — End: 2013-11-15
  Administered 2013-11-10 (×3): 4 mg via INTRAVENOUS
  Filled 2013-11-09 (×3): qty 2

## 2013-11-09 MED ORDER — ONDANSETRON HCL 4 MG PO TABS
4.0000 mg | ORAL_TABLET | Freq: Four times a day (QID) | ORAL | Status: DC | PRN
  Administered 2013-11-10: 4 mg via ORAL
  Filled 2013-11-09: qty 1

## 2013-11-09 MED ORDER — TISSEEL VH 10 ML EX KIT
PACK | CUTANEOUS | Status: AC
  Filled 2013-11-09: qty 2

## 2013-11-09 SURGICAL SUPPLY — 92 items
APPLICATOR DUAL LIQUID (MISCELLANEOUS) IMPLANT
APPLIER CLIP 5 13 M/L LIGAMAX5 (MISCELLANEOUS)
APPLIER CLIP ROT 10 11.4 M/L (STAPLE) ×2
BLADE EXTENDED COATED 6.5IN (ELECTRODE) IMPLANT
BLADE HEX COATED 2.75 (ELECTRODE) ×2 IMPLANT
BLADE SURG SZ10 CARB STEEL (BLADE) ×2 IMPLANT
CANISTER SUCTION 2500CC (MISCELLANEOUS) IMPLANT
CHLORAPREP W/TINT 26ML (MISCELLANEOUS) ×2 IMPLANT
CLIP APPLIE 5 13 M/L LIGAMAX5 (MISCELLANEOUS) IMPLANT
CLIP APPLIE ROT 10 11.4 M/L (STAPLE) ×1 IMPLANT
CLIP LIGATING HEM O LOK PURPLE (MISCELLANEOUS) ×4 IMPLANT
CLIP LIGATING HEMO O LOK GREEN (MISCELLANEOUS) IMPLANT
CLIP TI LARGE 6 (CLIP) IMPLANT
COVER MAYO STAND STRL (DRAPES) IMPLANT
CUTTER FLEX LINEAR 45M (STAPLE) IMPLANT
DRAIN CHANNEL 19F RND (DRAIN) ×2 IMPLANT
DRAIN CHANNEL RND F F (WOUND CARE) IMPLANT
DRAPE LAPAROSCOPIC ABDOMINAL (DRAPES) IMPLANT
DRAPE SHEET LG 3/4 BI-LAMINATE (DRAPES) IMPLANT
DRAPE UNIVERSAL PACK (DRAPES) ×2 IMPLANT
DRAPE UTILITY XL STRL (DRAPES) ×4 IMPLANT
DRAPE WARM FLUID 44X44 (DRAPE) ×2 IMPLANT
DRESSING TELFA ISLAND 4X8 (GAUZE/BANDAGES/DRESSINGS) IMPLANT
DRSG PAD ABDOMINAL 8X10 ST (GAUZE/BANDAGES/DRESSINGS) IMPLANT
DRSG TELFA 4X10 ISLAND STR (GAUZE/BANDAGES/DRESSINGS) IMPLANT
DRSG TELFA PLUS 4X6 ADH ISLAND (GAUZE/BANDAGES/DRESSINGS) IMPLANT
ENDOLOOP SUT PDS II  0 18 (SUTURE)
ENDOLOOP SUT PDS II 0 18 (SUTURE) IMPLANT
EVACUATOR SILICONE 100CC (DRAIN) ×2 IMPLANT
GAUZE SPONGE 4X4 12PLY STRL (GAUZE/BANDAGES/DRESSINGS) IMPLANT
GLOVE BIO SURGEON STRL SZ 6 (GLOVE) ×2 IMPLANT
GLOVE INDICATOR 6.5 STRL GRN (GLOVE) ×2 IMPLANT
GOWN SPEC L4 XLG W/TWL (GOWN DISPOSABLE) ×6 IMPLANT
GOWN STRL REUS W/ TWL XL LVL3 (GOWN DISPOSABLE) ×3 IMPLANT
GOWN STRL REUS W/TWL XL LVL3 (GOWN DISPOSABLE) ×3
KIT BASIN OR (CUSTOM PROCEDURE TRAY) ×2 IMPLANT
NEEDLE BIOPSY 14GX4.5 SOFT TIS (NEEDLE) IMPLANT
NEEDLE BIOPSY 14X6 SOFT TISS (NEEDLE) IMPLANT
PENCIL BUTTON HOLSTER BLD 10FT (ELECTRODE) ×2 IMPLANT
RELOAD 45 VASCULAR/THIN (ENDOMECHANICALS) IMPLANT
RELOAD STAPLE TA45 3.5 REG BLU (ENDOMECHANICALS) IMPLANT
RELOAD STAPLER BLUE 60MM (STAPLE) ×1 IMPLANT
RELOAD STAPLER GOLD 60MM (STAPLE) IMPLANT
RELOAD STAPLER WHITE 60MM (STAPLE) ×1 IMPLANT
SET IRRIG TUBING LAPAROSCOPIC (IRRIGATION / IRRIGATOR) ×2 IMPLANT
SHEARS FOC LG CVD HARMONIC 17C (MISCELLANEOUS) IMPLANT
SHEARS HARMONIC ACE PLUS 36CM (ENDOMECHANICALS) IMPLANT
SLEEVE SURGEON STRL (DRAPES) IMPLANT
SLEEVE XCEL OPT CAN 5 100 (ENDOMECHANICALS) ×2 IMPLANT
SPONGE DRAIN TRACH 4X4 STRL 2S (GAUZE/BANDAGES/DRESSINGS) ×2 IMPLANT
SPONGE LAP 18X18 X RAY DECT (DISPOSABLE) ×2 IMPLANT
STAPLE ECHEON FLEX 60 POW ENDO (STAPLE) IMPLANT
STAPLER RELOAD BLUE 60MM (STAPLE) ×2
STAPLER RELOAD GOLD 60MM (STAPLE)
STAPLER RELOAD WHITE 60MM (STAPLE) ×2
STAPLER VISISTAT 35W (STAPLE) IMPLANT
STRIP PERI DRY VERITAS 60 (STAPLE) ×2 IMPLANT
SUCTION POOLE TIP (SUCTIONS) IMPLANT
SUT ETHILON 2 0 PS N (SUTURE) ×4 IMPLANT
SUT MNCRL AB 4-0 PS2 18 (SUTURE) IMPLANT
SUT PDS AB 1 TP1 54 (SUTURE) IMPLANT
SUT PDS AB 1 TP1 96 (SUTURE) IMPLANT
SUT PROLENE 3 0 SH1 36 (SUTURE) IMPLANT
SUT SILK 2 0 (SUTURE)
SUT SILK 2 0 SH CR/8 (SUTURE) IMPLANT
SUT SILK 2-0 18XBRD TIE 12 (SUTURE) IMPLANT
SUT SILK 3 0 (SUTURE)
SUT SILK 3 0 SH CR/8 (SUTURE) IMPLANT
SUT SILK 3-0 18XBRD TIE 12 (SUTURE) IMPLANT
SUT VIC AB 3-0 SH 18 (SUTURE) IMPLANT
SUT VIC AB 4-0 SH 18 (SUTURE) IMPLANT
SUT VICRYL 0 ENDOLOOP (SUTURE) IMPLANT
SUT VICRYL 0 UR6 27IN ABS (SUTURE) ×6 IMPLANT
SUT VICRYL 2 0 18  UND BR (SUTURE)
SUT VICRYL 2 0 18 UND BR (SUTURE) IMPLANT
SUT VICRYL 3 0 BR 18  UND (SUTURE)
SUT VICRYL 3 0 BR 18 UND (SUTURE) IMPLANT
SUT VLOC 180 2-0 9IN GS21 (SUTURE) ×6 IMPLANT
SYR BULB IRRIGATION 50ML (SYRINGE) ×2 IMPLANT
SYS LAPSCP GELPORT 120MM (MISCELLANEOUS)
SYSTEM LAPSCP GELPORT 120MM (MISCELLANEOUS) IMPLANT
TOWEL OR 17X26 10 PK STRL BLUE (TOWEL DISPOSABLE) ×2 IMPLANT
TOWEL OR NON WOVEN STRL DISP B (DISPOSABLE) ×4 IMPLANT
TRAY FOLEY CATH 14FRSI W/METER (CATHETERS) ×2 IMPLANT
TRAY LAP CHOLE (CUSTOM PROCEDURE TRAY) ×2 IMPLANT
TROCAR BLADELESS OPT 5 100 (ENDOMECHANICALS) ×2 IMPLANT
TROCAR XCEL 12X100 BLDLESS (ENDOMECHANICALS) IMPLANT
TROCAR XCEL BLUNT TIP 100MML (ENDOMECHANICALS) IMPLANT
TROCAR XCEL NON-BLD 11X100MML (ENDOMECHANICALS) IMPLANT
TUBE FEEDING 5FR 36IN KANGAROO (TUBING) IMPLANT
TUBING FILTER THERMOFLATOR (ELECTROSURGICAL) ×2 IMPLANT
YANKAUER SUCT BULB TIP 10FT TU (MISCELLANEOUS) ×2 IMPLANT

## 2013-11-09 NOTE — Op Note (Signed)
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      POSTOPERATIVE DIAGNOSIS:  Same      PROCEDURE:  Robotic spleen-sparing distal pancreatectomy, repair of cyst-gastric fistula      SURGEON:  Almond Lint, MD      ASSISTANT:  Axel Filler, M.D.  And Wenda Low, MD     ANESTHESIA:  General and local.      FINDINGS:  Cystic mass with necrotic pancreas and pancreatic debris.  Densely adherent to stomach.       SPECIMEN:  Distal pancreas to Pathology.      ESTIMATED BLOOD LOSS:  400 mL       COMPLICATIONS:  None known.      PROCEDURE:  Patient was identified in the holding area and taken to   operating room where he was placed supine on the operating room table.   General anesthesia was induced.  Foley catheter was placed.  He was   placed in the leaning spleen position.  His abdomen was prepped and   draped in sterile fashion.  Time-out was performed according to surgical   safety check list.  When all was correct, we continued.    The infraumbilical skin was anesthetized with local anesthetic.  A vertical 1.5 cm incision   was made with #11 blade.  The subcutaneous tissues were spread with a   Kelly clamp and the umbilical fascia was elevated with 2 Kochers.   Fascia was incised in the midline with 11 blade and a 0 Vicryl   pursestring suture was placed around the fascial incision.  The tails of   the suture were used to hold the Hasson trocar and placed in the   abdominal cavity.  Pneumoperitoneum was achieved.        Four robotic trocars were placed in a crescent in the left abdomen and right upper abdomen.  A 5 mm trocar was placed in the LLQ.   The lesser sac was opened with the  Vessel sealer and all the adhesions were taken down.  The stomach was unable to be completely mobilized off the superior border of the pancreas.  It was also adherent to the colon wall.  The scissors were used to mobilize this off the    stomach and the colon wall.  Thick  material was seen along with pancreatic debris in the mass.  The pancreas was very friable and was not able to retracted well.  Eventually, we were able to get underneath the pancreas and separate it off the splenic vessels.     The pancreas was then isolated off  the vessels and the pancreas was stapled with 1 fires of the   Eschelon stapler.  The pancreas was then mobilized off the splenic vein.  There was a small branch that came off, and the splenic vein was then stapled with a white load of the Echelon.     The specimen was then placed into an endocatch bag and retrieved via the umbilical port.  The abdomen was copiously irrigated and there was   no sign of any additional bleeding.   The stomach defect was then closed with 2 V-loc sutures.  The serosa of the colon was reinforced with a V-loc suture.   Tisseel was placed over the tail of   the pancreas.    The pancreas was sent for frozen section.  It was benign.  The robot was undocked.   The 19 Blake drain was  then passed into the abdomen through a medial port and pulled out through one of the other trocars.   This was placed in the appropriate location.  The hasson site was closed with a running 0-0 vicryl.   The skin of all the incisions was   then closed with 4-0 Monocryl in a subcuticular fashion and then dressed   with dermabond.  The patient tolerated   the procedure well.  He was extubated and taken to the PACU in stable   condition.  Needle, sponge, and instrument counts were correct x2               Almond LintFaera Courtni Balash, MD

## 2013-11-09 NOTE — Transfer of Care (Signed)
Immediate Anesthesia Transfer of Care Note  Patient: Tyler Smith  Procedure(s) Performed: Procedure(s): ROBOTIC DISTAL PANCREATECTOMY (N/A)  Patient Location: PACU  Anesthesia Type:General  Level of Consciousness: awake, alert , oriented and patient cooperative  Airway & Oxygen Therapy: Patient Spontanous Breathing and Patient connected to face mask oxygen  Post-op Assessment: Report given to PACU RN, Post -op Vital signs reviewed and stable and Patient moving all extremities X 4  Post vital signs: Reviewed and stable  Complications: No apparent anesthesia complications

## 2013-11-09 NOTE — H&P (Signed)
Chief Complaint   Patient presents with   .  pancreatic cyst   Referring MD: Dr. Arta Smith  HISTORY:  She is a 53 year old male who had an incidentally discovered pancreatic cyst mass. He is referred by Dr. Paulita Smith for consultation regarding this mass. He had a chronic cough and underwent a chest x-ray. The chest x-ray was nonrevealing and a chest CT was ordered. This chest CT demonstrated a 3 cm lesion in the distal pancreas. The patient subsequently underwent an endoscopic ultrasound. The cytology demonstrated some mucinous fluid and atypical cells on the FNA. The patient has no symptoms related to the pancreas that he is aware of. He does sometimes have some distention. He denies any issues with his blood glucose, diarrhea, jaundice, pancreatitis, family history of pancreatitis or nausea and vomiting.  Of note, his mother did have breast cancer and brain cancer. She passed away at age 3. He and his wife are not sure if the brain cancer was primary or metastatic. His chronic cough has resolved.    Past Medical History   Diagnosis  Date   .  Thyroid disease    .  Hypercholesterolemia    .  GERD (gastroesophageal reflux disease)      none recent   .  Hypothyroidism     Past Surgical History   Procedure  Laterality  Date   .  Eye surgery  Bilateral  age 20     lazy eye   .  Pinkie finger surgfery  Right  yrs ago   .  Eus  N/A  09/01/2013     Procedure: ESOPHAGEAL ENDOSCOPIC ULTRASOUND (EUS) RADIAL; Surgeon: Tyler Silence, MD; Location: [facility] ENDOSCOPY; Service: Endoscopy; Laterality: N/A;   .  Fine needle aspiration  N/A  09/01/2013     Procedure: FINE NEEDLE ASPIRATION (FNA) LINEAR; Surgeon: Tyler Silence, MD; Location: [facility] ENDOSCOPY; Service: Endoscopy; Laterality: N/A;    Current Outpatient Prescriptions   Medication  Sig  Dispense  Refill   .  aspirin EC 81 MG tablet  Take 81 mg by mouth daily.     Marland Kitchen  levothyroxine (SYNTHROID, LEVOTHROID) 100 MCG tablet  Take 100 mcg by mouth daily  before breakfast.     .  pravastatin (PRAVACHOL) 40 MG tablet  Take 40 mg by mouth daily.      No current facility-administered medications for this visit.    Allergies   Allergen  Reactions   .  Penicillins  Hives    Family History   Problem  Relation  Age of Onset   .  Cancer - Other  Mother    .  COPD  Father    .  Coronary artery disease  Brother    .  Coronary artery disease  Brother     History    Social History   .  Marital Status:  Unknown     Spouse Name:  N/A     Number of Children:  N/A   .  Years of Education:  N/A    Social History Main Topics   .  Smoking status:  Never Smoker   .  Smokeless tobacco:  Never Used   .  Alcohol Use:  Yes      Comment: occasional   .  Drug Use:  No   .  Sexual Activity:  None    Other Topics  Concern   .  None    Social History Narrative   .  None  REVIEW OF SYSTEMS - PERTINENT POSITIVES ONLY:  12 point review of systems negative other than HPI and PMH except for bloating.   EXAM:  Wt Readings from Last 3 Encounters:  11/09/13 228 lb 4 oz (103.534 kg)  11/09/13 228 lb 4 oz (103.534 kg)  11/05/13 228 lb 4 oz (103.534 kg)   Temp Readings from Last 3 Encounters:  11/09/13 97.7 F (36.5 C) Oral  11/09/13 97.7 F (36.5 C) Oral  11/05/13 98.4 F (36.9 C) Oral   BP Readings from Last 3 Encounters:  11/09/13 141/98  11/09/13 141/98  11/05/13 136/84   Pulse Readings from Last 3 Encounters:  11/09/13 95  11/09/13 95  11/05/13 72    Gen: No acute distress. Well nourished and well groomed.  Neurological: Alert and oriented to person, place, and time. Coordination normal.  Head: Normocephalic and atraumatic.  Eyes: Conjunctivae are normal. Pupils are equal, round, and reactive to light. No scleral icterus.  Respiratory: Effort normal. No respiratory distress. No chest wall tenderness.  GI: Soft. The abdomen is soft and nontender. There is no rebound and no guarding.  Musculoskeletal: Normal range of motion.  Extremities are nontender.  Skin: Skin is warm and dry. No rash noted. No diaphoresis. No erythema. No pallor. No clubbing, cyanosis, or edema.  Psychiatric: Normal mood and affect. Behavior is normal. Judgment and thought content normal.   LABORATORY RESULTS:  Available labs are reviewed  Cytology  Diagnosis  FINE NEEDLE ASPIRATION: NEEDLE ASPIRATION: NEEDLE ASPIRATION, ENDOSCOPIC, PANCREAS  TAIL(SPECIMEN 1 OF 1 COLLECTED 09/01/13):  ATYPICAL CELLS PRESENT, SUSPICIOUS FOR MUCIN PRODUCING CYSTIC NEOPLASM SEE  COMMENT.  COMMENT: THE CASE WAS REVIEWED BY DR Tyler Smith, WHO CONCURS.    Recent Results (from the past 2160 hour(s))   PANC CYST FLD ANLYS-PATHFNDR-TG Status: None    Collection Time    09/01/13 12:30 PM   Result  Value  Ref Range    Pathfinder TG, Pancreatic Cyst Fluid  SPECIMEN SENT TO [facility]     RADIOLOGY RESULTS:  See E-Chart or I-Site for most recent results. Images and reports are reviewed.  CT chest  IMPRESSION:  No evidence of pulmonary mass/nodule ; preceding radiographic  finding likely represented a summation artifact.  Cystic mass at tail of pancreas 3.0 x 2.4 x 2.6 cm.  Questionable low-attenuation lesion at posterior RIGHT lobe liver  2.0 x 1.4 x 1.5 cm.  Further evaluation of these findings by MR imaging with and without  contrast recommended.  MRI Abdomen  IMPRESSION: 1. Complex cystic lesion arising from the pancreatic tail. This has indeterminate imaging characteristics. Considerations include a pseudocyst complicated by prior hemorrhage versus a cystic neoplasm such as intraductal papillary mucinous tumor or less likely islet cell tumor. If the patient is a good endoscopy candidate, endoscopic ultrasound with aspiration should be considered. If not, follow-up with pre and post contrast abdominal MRI is recommended at 3- 6 months. 2. Two right liver lobe hemangiomas   ASSESSMENT AND PLAN:  Cystic mass of pancreas  This mass has some  concerning features including the mucin, the complex cystic/solid components, and the size. His mother did have breast cancer and possibly a second cancer. If the mass in the pancreas is malignant, he is would need to be checked for BRCA.  We will plan surgical excision. I would plan on removing the distal pancreas robotically or laparoscopically. I would assess the mass intraoperatively. If it is extremely firm, I would send it for frozen section and take the  spleen if malignant.   I discussed the surgery with diagrams of anatomy. I reviewed the anticipated hospital stay, recovery time, and restrictions. I discussed the most common complications including the ones below:  Bleeding  Infection and possible wound complications such as hernia  Damage to adjacent structures  Leak of the pancreas (5-10%)  Possible need for other procedures, such as abscess drains in radiology or endoscopy.  Possible prolonged hospital stay  Possible development of diabetes or worsening of current diabetes. (5-20%)  Possible diarrhea from lack of pancreatic enzymes.  Prolonged fatigue/weakness/appetite  MOST PATIENTS' ENERGY LEVEL IS NOT BACK TO NORMAL FOR AT LEAST 2-3 MONTHS. OLDER PATIENTS MAY FEEL WEAK FOR LONGER PERIODS OF TIME.  Possible early recurrence of cancer  Possible complications of your medical problems such as heart disease or arrhythmias.  Death (less than 1%)  He understands and wishes to proceed.    Tyler Height MD  [facility]  ADDENDUM - PANCREATIC CYST FLUID CEA 56, 940  AMYLASE 3,574    Visit Diagnoses:  1.  Cystic mass of pancreas    Primary Care Physician:  Tyler Coffin, MD  Other care team  Tyler Silence, MD

## 2013-11-09 NOTE — Anesthesia Postprocedure Evaluation (Signed)
Anesthesia Post Note  Patient: Tyler ProphetJamie L Safley  Procedure(s) Performed: Procedure(s) (LRB): ROBOTIC DISTAL PANCREATECTOMY (N/A)  Anesthesia type: General  Patient location: PACU  Post pain: Pain level controlled  Post assessment: Post-op Vital signs reviewed  Last Vitals: BP 148/89  Pulse 106  Temp(Src) 36.9 C (Oral)  Resp 15  Ht 5\' 11"  (1.803 m)  Wt 228 lb 4 oz (103.534 kg)  BMI 31.85 kg/m2  SpO2 93%  Post vital signs: Reviewed  Level of consciousness: sedated  Complications: No apparent anesthesia complications

## 2013-11-09 NOTE — Anesthesia Preprocedure Evaluation (Signed)
Anesthesia Evaluation  Patient identified by MRN, date of birth, ID band Patient awake    Reviewed: Allergy & Precautions, H&P , NPO status , Patient's Chart, lab work & pertinent test results  Airway Mallampati: II TM Distance: >3 FB Neck ROM: Full    Dental no notable dental hx.    Pulmonary neg pulmonary ROS,  breath sounds clear to auscultation  Pulmonary exam normal       Cardiovascular negative cardio ROS  Rhythm:Regular Rate:Normal     Neuro/Psych negative neurological ROS  negative psych ROS   GI/Hepatic Neg liver ROS, GERD-  ,  Endo/Other  Hypothyroidism   Renal/GU negative Renal ROS     Musculoskeletal negative musculoskeletal ROS (+)   Abdominal   Peds  Hematology negative hematology ROS (+)   Anesthesia Other Findings   Reproductive/Obstetrics negative OB ROS                           Anesthesia Physical  Anesthesia Plan  ASA: II  Anesthesia Plan: General   Post-op Pain Management:    Induction: Intravenous  Airway Management Planned: Oral ETT  Additional Equipment: Arterial line  Intra-op Plan:   Post-operative Plan: Extubation in OR  Informed Consent: I have reviewed the patients History and Physical, chart, labs and discussed the procedure including the risks, benefits and alternatives for the proposed anesthesia with the patient or authorized representative who has indicated his/her understanding and acceptance.   Dental advisory given  Plan Discussed with: CRNA  Anesthesia Plan Comments: (2x piv)        Anesthesia Quick Evaluation

## 2013-11-10 LAB — BASIC METABOLIC PANEL
ANION GAP: 13 (ref 5–15)
BUN: 7 mg/dL (ref 6–23)
CO2: 23 mEq/L (ref 19–32)
CREATININE: 0.84 mg/dL (ref 0.50–1.35)
Calcium: 8.5 mg/dL (ref 8.4–10.5)
Chloride: 96 mEq/L (ref 96–112)
GFR calc non Af Amer: >90 mL/min (ref >90)
Glucose, Bld: 192 mg/dL — ABNORMAL HIGH (ref 70–99)
Potassium: 4.2 mEq/L (ref 3.7–5.3)
Sodium: 132 mEq/L — ABNORMAL LOW (ref 137–147)

## 2013-11-10 LAB — CBC
HCT: 40.6 % (ref 39.0–52.0)
Hemoglobin: 13.9 g/dL (ref 13.0–17.0)
MCH: 29.1 pg (ref 26.0–34.0)
MCHC: 34.2 g/dL (ref 30.0–36.0)
MCV: 84.9 fL (ref 78.0–100.0)
Platelets: 290 10*3/uL (ref 150–400)
RBC: 4.78 MIL/uL (ref 4.22–5.81)
RDW: 12.2 % (ref 11.5–15.5)
WBC: 15.9 10*3/uL — ABNORMAL HIGH (ref 4.0–10.5)

## 2013-11-10 LAB — PHOSPHORUS: Phosphorus: 2.5 mg/dL (ref 2.3–4.6)

## 2013-11-10 LAB — MAGNESIUM: Magnesium: 1.7 mg/dL (ref 1.5–2.5)

## 2013-11-10 MED ORDER — LEVOTHYROXINE SODIUM 100 MCG IV SOLR
50.0000 ug | Freq: Every day | INTRAVENOUS | Status: DC
  Administered 2013-11-10 – 2013-11-12 (×3): 50 ug via INTRAVENOUS
  Filled 2013-11-10 (×5): qty 5

## 2013-11-10 MED ORDER — ENOXAPARIN SODIUM 60 MG/0.6ML ~~LOC~~ SOLN
50.0000 mg | SUBCUTANEOUS | Status: DC
  Administered 2013-11-10 – 2013-11-15 (×6): 50 mg via SUBCUTANEOUS
  Filled 2013-11-10 (×6): qty 0.6

## 2013-11-10 NOTE — Plan of Care (Signed)
Problem: Phase I Progression Outcomes Goal: Voiding-avoid urinary catheter unless indicated Outcome: Not Met (add Reason) Foley placed for surgery.

## 2013-11-10 NOTE — Progress Notes (Signed)
Utilization review completed.  

## 2013-11-10 NOTE — Care Management Note (Unsigned)
    Page 1 of 1   11/10/2013     1:08:42 PM CARE MANAGEMENT NOTE 11/10/2013  Patient:  Tyler Smith,Tyler Smith   Account Number:  [id number]  Date Initiated:  11/10/2013  Documentation initiated by:  [facility]Ann Gro[facility]veld  Subjective/Objective Assessment:   adm: s/p Procedure(s):  ROBOTIC DISTAL PANCREATECTOMY (N/A)     Action/Plan:   discharge planning   Anticipated DC Date:  11/13/2013   Anticipated DC Plan:  HOME/SELF CARE         Choice offered to / List presented to:             Status of service:  In process, will continue to follow Medicare Important Message given?   (If response is "NO", the following Medicare IM given date fields will be blank) Date Medicare IM given:   Medicare IM given by:   Date Additional Medicare IM given:   Additional Medicare IM given by:    Discharge Disposition:    Per UR Regulation:    If discussed at Long Length of Stay Meetings, dates discussed:    Comments:  11/10/13 Anticipate discharge home with no needs but will continue to follow if disposiiton changes.  Tyler Smith, BSN, CM [phone].

## 2013-11-10 NOTE — Progress Notes (Signed)
1 Day Post-Op  Subjective: 1 episode of emesis overnight.   Pain controlled this AM, but had pain issues last night when he got behind.    Objective: Vital signs in last 24 hours: Temp:  [97.7 F (36.5 C)-98.7 F (37.1 C)] 98.4 F (36.9 C) (09/30 0507) Pulse Rate:  [95-108] 101 (09/30 0507) Resp:  [15-20] 17 (09/30 0507) BP: (131-148)/(73-98) 131/82 mmHg (09/30 0507) SpO2:  [91 %-99 %] 93 % (09/30 0507) Arterial Line BP: (156)/(86) 156/86 mmHg (09/29 1601) Weight:  [228 lb 4 oz (103.534 kg)] 228 lb 4 oz (103.534 kg) (09/29 0749) Last BM Date: 11/08/13  Intake/Output from previous day: 09/29 0701 - 09/30 0700 In: 5610 [I.V.:5610] Out: 2940 [Urine:2550; Drains:90; Blood:300] Intake/Output this shift: Total I/O In: 360 [I.V.:360] Out: 1600 [Urine:1600]  General appearance: alert, cooperative and no distress Resp: breathing comfortably GI: soft, sl distended, bruising at umbilicus, drain serosang.  Lab Results:   Recent Labs  11/09/13 1800 11/10/13 0433  WBC 15.1* 15.9*  HGB 14.4 13.9  HCT 42.8 40.6  PLT 243 290   BMET  Recent Labs  11/09/13 1800 11/10/13 0433  NA  --  132*  K  --  4.2  CL  --  96  CO2  --  23  GLUCOSE  --  192*  BUN  --  7  CREATININE 0.83 0.84  CALCIUM  --  8.5   PT/INR No results found for this basename: LABPROT, INR,  in the last 72 hours ABG No results found for this basename: PHART, PCO2, PO2, HCO3,  in the last 72 hours  Studies/Results: No results found.  Anti-infectives: Anti-infectives   Start     Dose/Rate Route Frequency Ordered Stop   11/09/13 1800  ciprofloxacin (CIPRO) IVPB 400 mg     400 mg 200 mL/hr over 60 Minutes Intravenous Every 12 hours 11/09/13 1738 11/09/13 1948   11/09/13 0724  ciprofloxacin (CIPRO) IVPB 400 mg     400 mg 200 mL/hr over 60 Minutes Intravenous On call to O.R. 11/09/13 0724 11/09/13 1040      Assessment/Plan: s/p Procedure(s): ROBOTIC DISTAL PANCREATECTOMY (N/A) d/c foley PAS leave  NPO x ice chips for 1 more day. Start lovenox today. Ambulate Pulmonary toilet.    LOS: 1 day    [facility] LPBYERLY,Jinny Sweetland 11/10/2013

## 2013-11-11 LAB — CBC
HEMATOCRIT: 41.4 % (ref 39.0–52.0)
Hemoglobin: 13.8 g/dL (ref 13.0–17.0)
MCH: 28.9 pg (ref 26.0–34.0)
MCHC: 33.3 g/dL (ref 30.0–36.0)
MCV: 86.8 fL (ref 78.0–100.0)
Platelets: 239 10*3/uL (ref 150–400)
RBC: 4.77 MIL/uL (ref 4.22–5.81)
RDW: 12.4 % (ref 11.5–15.5)
WBC: 15.4 10*3/uL — ABNORMAL HIGH (ref 4.0–10.5)

## 2013-11-11 LAB — BASIC METABOLIC PANEL
Anion gap: 11 (ref 5–15)
BUN: 6 mg/dL (ref 6–23)
CO2: 26 mEq/L (ref 19–32)
Calcium: 8.5 mg/dL (ref 8.4–10.5)
Chloride: 98 mEq/L (ref 96–112)
Creatinine, Ser: 0.84 mg/dL (ref 0.50–1.35)
Glucose, Bld: 166 mg/dL — ABNORMAL HIGH (ref 70–99)
Potassium: 3.9 mEq/L (ref 3.7–5.3)
Sodium: 135 mEq/L — ABNORMAL LOW (ref 137–147)

## 2013-11-11 NOTE — Progress Notes (Signed)
Patient ID: Tyler PesterJamie L Fales, male   DOB: 10/17/1960, 53 y.o.   MRN: [id number] 2 Days Post-Op  Subjective: Emesis resolved.  Having some bloating.  Was out of bed yesterday.  Foley was removed and he was able to void spontaneously.      Objective: Vital signs in last 24 hours: Temp:  [98.2 F (36.8 C)-99.5 F (37.5 C)] 99.2 F (37.3 C) (10/01 0539) Pulse Rate:  [100-105] 100 (10/01 0539) Resp:  [16-17] 17 (10/01 0539) BP: (125-133)/(74-91) 127/91 mmHg (10/01 0539) SpO2:  [93 %-98 %] 95 % (10/01 0539) Last BM Date: 11/08/13  Intake/Output from previous day: 09/30 0701 - 10/01 0700 In: 2476.7 [P.O.:120; I.V.:2356.7] Out: 4047 [Urine:3900; Drains:147] Intake/Output this shift: Total I/O In: -  Out: 530 [Urine:500; Drains:30]  General appearance: alert, cooperative and no distress Resp: breathing comfortably GI: soft, sl distended, bruising at umbilicus, drain serosang.  Lab Results:   Recent Labs  11/10/13 0433 11/11/13 0430  WBC 15.9* 15.4*  HGB 13.9 13.8  HCT 40.6 41.4  PLT 290 239   BMET  Recent Labs  11/10/13 0433 11/11/13 0430  NA 132* 135*  K 4.2 3.9  CL 96 98  CO2 23 26  GLUCOSE 192* 166*  BUN 7 6  CREATININE 0.84 0.84  CALCIUM 8.5 8.5   PT/INR No results found for this basename: LABPROT, INR,  in the last 72 hours ABG No results found for this basename: PHART, PCO2, PO2, HCO3,  in the last 72 hours  Studies/Results: No results found.  Anti-infectives: Anti-infectives   Start     Dose/Rate Route Frequency Ordered Stop   11/09/13 1800  ciprofloxacin (CIPRO) IVPB 400 mg     400 mg 200 mL/hr over 60 Minutes Intravenous Every 12 hours 11/09/13 1738 11/09/13 1948   11/09/13 0724  ciprofloxacin (CIPRO) IVPB 400 mg     400 mg 200 mL/hr over 60 Minutes Intravenous On call to O.R. 11/09/13 0724 11/09/13 1040      Assessment/Plan: s/p Procedure(s): ROBOTIC DISTAL PANCREATECTOMY (N/A) Clears. Lovenox for VTE prophylaxis Ambulate Pulmonary toilet.     LOS: 2 days    Gaddiel Cullens 11/11/2013

## 2013-11-12 LAB — CBC WITH DIFFERENTIAL/PLATELET
BASOS ABS: 0 10*3/uL (ref 0.0–0.1)
Basophils Relative: 0 % (ref 0–1)
Eosinophils Absolute: 0 10*3/uL (ref 0.0–0.7)
Eosinophils Relative: 0 % (ref 0–5)
HCT: 40 % (ref 39.0–52.0)
HEMOGLOBIN: 13.5 g/dL (ref 13.0–17.0)
LYMPHS PCT: 10 % — AB (ref 12–46)
Lymphs Abs: 1.5 10*3/uL (ref 0.7–4.0)
MCH: 29.3 pg (ref 26.0–34.0)
MCHC: 33.8 g/dL (ref 30.0–36.0)
MCV: 87 fL (ref 78.0–100.0)
MONOS PCT: 14 % — AB (ref 3–12)
Monocytes Absolute: 2 10*3/uL — ABNORMAL HIGH (ref 0.1–1.0)
NEUTROS ABS: 10.8 10*3/uL — AB (ref 1.7–7.7)
Neutrophils Relative %: 76 % (ref 43–77)
Platelets: 216 10*3/uL (ref 150–400)
RBC: 4.6 MIL/uL (ref 4.22–5.81)
RDW: 12.2 % (ref 11.5–15.5)
WBC: 14.4 10*3/uL — AB (ref 4.0–10.5)

## 2013-11-12 LAB — COMPREHENSIVE METABOLIC PANEL
ALT: 24 U/L (ref 0–53)
ANION GAP: 11 (ref 5–15)
AST: 19 U/L (ref 0–37)
Albumin: 2.8 g/dL — ABNORMAL LOW (ref 3.5–5.2)
Alkaline Phosphatase: 58 U/L (ref 39–117)
BUN: 6 mg/dL (ref 6–23)
CALCIUM: 8.4 mg/dL (ref 8.4–10.5)
CO2: 26 mEq/L (ref 19–32)
CREATININE: 0.75 mg/dL (ref 0.50–1.35)
Chloride: 95 mEq/L — ABNORMAL LOW (ref 96–112)
GFR calc Af Amer: >90 mL/min (ref >90)
GFR calc non Af Amer: >90 mL/min (ref >90)
Glucose, Bld: 152 mg/dL — ABNORMAL HIGH (ref 70–99)
Potassium: 3.9 mEq/L (ref 3.7–5.3)
Sodium: 132 mEq/L — ABNORMAL LOW (ref 137–147)
TOTAL PROTEIN: 6.5 g/dL (ref 6.0–8.3)
Total Bilirubin: 1 mg/dL (ref 0.3–1.2)

## 2013-11-12 NOTE — Progress Notes (Signed)
CARE MANAGEMENT NOTE 11/12/2013  Patient:  Tyler Smith,Tyler Smith   Account Number:  [id number]  Date Initiated:  11/10/2013  Documentation initiated by:  [facility]FFRIES,SARAH  Subjective/Objective Assessment:   adm: s/p Procedure(s):  ROBOTIC DISTAL PANCREATECTOMY (N/A)     Action/Plan:   discharge planning   Anticipated DC Date:  11/15/2013   Anticipated DC Plan:  HOME/SELF CARE         Choice offered to / List presented to:             Status of service:  In process, will continue to follow Medicare Important Message given?   (If response is "NO", the following Medicare IM given date fields will be blank) Date Medicare IM given:   Medicare IM given by:   Date Additional Medicare IM given:   Additional Medicare IM given by:    Discharge Disposition:    Per UR Regulation:    If discussed at Long Length of Stay Meetings, dates discussed:    Comments:  [id number]/Tyler Aziz Stark JockDavis, RN, BSN, [place] [phone] Chart Reviewed. Discharge needs at time of review: None present will follow for needs.    11/10/13 Anticipate discharge home with no needs but will continue to follow if disposiiton changes.  Freddy JakschSarah Jeffries, BSN, CM [phone].

## 2013-11-12 NOTE — Progress Notes (Signed)
Patient ID: Tyler PesterJamie L Smith, male   DOB: 01/22/1961, 53 y.o.   MRN: [id number] 3 Days Post-Op  Subjective: Feeling much better today.  No n/v.  Getting ready to bathe.  Has been ambulating and doing IS.  Did have fevers yesterday.        Objective: Vital signs in last 24 hours: Temp:  [99.5 F (37.5 C)-102 F (38.9 C)] 99.5 F (37.5 C) (10/02 0536) Pulse Rate:  [94-106] 94 (10/02 0536) Resp:  [15-16] 16 (10/02 0536) BP: (128-139)/(81-91) 132/91 mmHg (10/02 0536) SpO2:  [92 %-94 %] 92 % (10/02 0536) Last BM Date: 11/09/13  Intake/Output from previous day: 10/01 0701 - 10/02 0700 In: 1543.3 [I.V.:1543.3] Out: 1820 [Urine:1675; Drains:145] Intake/Output this shift: Total I/O In: -  Out: 500 [Urine:500]  General appearance: alert, cooperative and no distress Resp: breathing comfortably GI: soft, sl distended, bruising at umbilicus, drain serosang.  Lab Results:   Recent Labs  11/11/13 0430 11/12/13 0449  WBC 15.4* 14.4*  HGB 13.8 13.5  HCT 41.4 40.0  PLT 239 216   BMET  Recent Labs  11/11/13 0430 11/12/13 0449  NA 135* 132*  K 3.9 3.9  CL 98 95*  CO2 26 26  GLUCOSE 166* 152*  BUN 6 6  CREATININE 0.84 0.75  CALCIUM 8.5 8.4   PT/INR No results found for this basename: LABPROT, INR,  in the last 72 hours ABG No results found for this basename: PHART, PCO2, PO2, HCO3,  in the last 72 hours  Studies/Results: No results found.  Anti-infectives: Anti-infectives   Start     Dose/Rate Route Frequency Ordered Stop   11/09/13 1800  ciprofloxacin (CIPRO) IVPB 400 mg     400 mg 200 mL/hr over 60 Minutes Intravenous Every 12 hours 11/09/13 1738 11/09/13 1948   11/09/13 0724  ciprofloxacin (CIPRO) IVPB 400 mg     400 mg 200 mL/hr over 60 Minutes Intravenous On call to O.R. 11/09/13 0724 11/09/13 1040      Assessment/Plan: s/p Procedure(s): ROBOTIC DISTAL PANCREATECTOMY (N/A) Clears. Lovenox for VTE prophylaxis Ambulate Pulmonary toilet.  If febrile more  today, would do CT tomorrow.  No evidence of gastric secretions in drain.     LOS: 3 days    Carlisle Enke 11/12/2013

## 2013-11-13 LAB — CBC
HEMATOCRIT: 36.8 % — AB (ref 39.0–52.0)
HEMOGLOBIN: 12.4 g/dL — AB (ref 13.0–17.0)
MCH: 28.5 pg (ref 26.0–34.0)
MCHC: 33.7 g/dL (ref 30.0–36.0)
MCV: 84.6 fL (ref 78.0–100.0)
Platelets: 244 10*3/uL (ref 150–400)
RBC: 4.35 MIL/uL (ref 4.22–5.81)
RDW: 11.9 % (ref 11.5–15.5)
WBC: 11 10*3/uL — ABNORMAL HIGH (ref 4.0–10.5)

## 2013-11-13 MED ORDER — LEVOTHYROXINE SODIUM 100 MCG PO TABS
100.0000 ug | ORAL_TABLET | Freq: Every day | ORAL | Status: DC
  Administered 2013-11-13 – 2013-11-15 (×3): 100 ug via ORAL
  Filled 2013-11-13 (×4): qty 1

## 2013-11-13 NOTE — Progress Notes (Signed)
Patient ID: Tyler PesterJamie L Exline, male   DOB: 01/16/1961, 53 y.o.   MRN: [id number]  [facility] - Progress [facility]  POD# 4  Subjective: Patient without complaint.  No fever overnight. No BM's yet.  Ambulatory.  Objective: Vital signs in last 24 hours: Temp:  [98.4 F (36.9 C)-99.3 F (37.4 C)] 98.4 F (36.9 C) (10/03 0516) Pulse Rate:  [90-98] 91 (10/03 0516) Resp:  [16] 16 (10/03 0516) BP: (137-141)/(87-89) 137/87 mmHg (10/03 0516) SpO2:  [93 %-94 %] 94 % (10/03 0516) Last BM Date: 11/09/13  Intake/Output from previous day: 10/02 0701 - 10/03 0700 In: 2376.7 [P.O.:1300; I.V.:1076.7] Out: 2680 [Urine:2550; Drains:130]  Exam: HEENT - clear, not icteric Neck - soft Chest - clear bilaterally Cor - RRR, no murmur Abd - soft, mild distension; few BS present; wounds clear and dry; JP with thin serosanguinous Ext - no significant edema Neuro - grossly intact, no focal deficits  Lab Results:   Recent Labs  11/12/13 0449 11/13/13 0455  WBC 14.4* 11.0*  HGB 13.5 12.4*  HCT 40.0 36.8*  PLT 216 244     Recent Labs  11/11/13 0430 11/12/13 0449  NA 135* 132*  K 3.9 3.9  CL 98 95*  CO2 26 26  GLUCOSE 166* 152*  BUN 6 6  CREATININE 0.84 0.75  CALCIUM 8.5 8.4    Studies/Results: No results found.  Assessment / Plan: 1.  Status post distal pancreatectomy  Advance to full liquid diet  Ambulate in halls  Repeat CBC, BMET in AM 10/4  Monitor drain output with advancing diet  Velora Hecklerodd M. Carizma Dunsworth, MD, [facility] Office: [phone]  11/13/2013

## 2013-11-14 LAB — CBC
HEMATOCRIT: 37.9 % — AB (ref 39.0–52.0)
Hemoglobin: 12.8 g/dL — ABNORMAL LOW (ref 13.0–17.0)
MCH: 28.6 pg (ref 26.0–34.0)
MCHC: 33.8 g/dL (ref 30.0–36.0)
MCV: 84.8 fL (ref 78.0–100.0)
PLATELETS: 287 10*3/uL (ref 150–400)
RBC: 4.47 MIL/uL (ref 4.22–5.81)
RDW: 12.1 % (ref 11.5–15.5)
WBC: 10.3 10*3/uL (ref 4.0–10.5)

## 2013-11-14 LAB — BASIC METABOLIC PANEL
Anion gap: 12 (ref 5–15)
BUN: 7 mg/dL (ref 6–23)
CALCIUM: 8.4 mg/dL (ref 8.4–10.5)
CO2: 26 mEq/L (ref 19–32)
CREATININE: 0.76 mg/dL (ref 0.50–1.35)
Chloride: 98 mEq/L (ref 96–112)
GFR calc Af Amer: >90 mL/min (ref >90)
GFR calc non Af Amer: >90 mL/min (ref >90)
Glucose, Bld: 126 mg/dL — ABNORMAL HIGH (ref 70–99)
Potassium: 3.7 mEq/L (ref 3.7–5.3)
Sodium: 136 mEq/L — ABNORMAL LOW (ref 137–147)

## 2013-11-14 MED ORDER — HYDROCODONE-ACETAMINOPHEN 5-325 MG PO TABS
1.0000 | ORAL_TABLET | ORAL | Status: DC | PRN
  Administered 2013-11-14 (×2): 1 via ORAL
  Administered 2013-11-15: 2 via ORAL
  Filled 2013-11-14: qty 2
  Filled 2013-11-14: qty 1
  Filled 2013-11-14: qty 2

## 2013-11-14 NOTE — Progress Notes (Signed)
Patient ID: Tyler PesterJamie L Maybury, male   DOB: 06/24/1960, 53 y.o.   MRN: [id number]  [facility] - Progress Note  POD# 5  Subjective: Patient without complaints.  Tolerated full liquids.  Low grade fever 99.7.  No nausea or emesis.  Passing flatus per patient.  Objective: Vital signs in last 24 hours: Temp:  [98.7 F (37.1 C)-99.7 F (37.6 C)] 99.7 F (37.6 C) (10/04 0520) Pulse Rate:  [84-99] 84 (10/04 0520) Resp:  [16-20] 20 (10/04 0520) BP: (126-152)/(77-95) 152/95 mmHg (10/04 0520) SpO2:  [93 %-96 %] 93 % (10/04 0520) Last BM Date: 11/09/13  Intake/Output from previous day: 10/03 0701 - 10/04 0700 In: 840 [P.O.:840] Out: 1468 [Urine:1375; Drains:93]  Exam: HEENT - clear, not icteric Abd - soft without distension; wounds clear and dry; JP drain with serous - moderate Ext - no significant edema Neuro - grossly intact, no focal deficits  Lab Results:   Recent Labs  11/13/13 0455 11/14/13 0520  WBC 11.0* 10.3  HGB 12.4* 12.8*  HCT 36.8* 37.9*  PLT 244 287     Recent Labs  11/12/13 0449 11/14/13 0520  NA 132* 136*  K 3.9 3.7  CL 95* 98  CO2 26 26  GLUCOSE 152* 126*  BUN 6 7  CREATININE 0.75 0.76  CALCIUM 8.4 8.4    Studies/Results: No results found.  Assessment / Plan: 1.  Status post distal pancreatectomy  Advance to regular diet  PO pain Rx  WBC now normal, lytes OK  Ambulate  Velora Hecklerodd M. Thi Sisemore, MD, [facility] Office: [phone]  11/14/2013

## 2013-11-15 MED ORDER — MAGIC MOUTHWASH
15.0000 mL | Freq: Four times a day (QID) | ORAL | Status: DC | PRN
  Filled 2013-11-15: qty 15

## 2013-11-15 MED ORDER — LIP MEDEX EX OINT
1.0000 "application " | TOPICAL_OINTMENT | Freq: Two times a day (BID) | CUTANEOUS | Status: DC
  Administered 2013-11-15: 1 via TOPICAL
  Filled 2013-11-15: qty 7

## 2013-11-15 MED ORDER — SODIUM CHLORIDE 0.9 % IV SOLN: 250.0000 mL | INTRAVENOUS | Status: DC | PRN

## 2013-11-15 MED ORDER — ALUM & MAG HYDROXIDE-SIMETH 200-200-20 MG/5ML PO SUSP: 30.0000 mL | Freq: Four times a day (QID) | ORAL | Status: DC | PRN

## 2013-11-15 MED ORDER — SODIUM CHLORIDE 0.9 % IJ SOLN
3.0000 mL | Freq: Two times a day (BID) | INTRAMUSCULAR | Status: DC
  Administered 2013-11-15: 3 mL via INTRAVENOUS

## 2013-11-15 MED ORDER — POLYETHYLENE GLYCOL 3350 17 G PO PACK: 17.0000 g | PACK | Freq: Two times a day (BID) | ORAL | Status: DC | PRN

## 2013-11-15 MED ORDER — SODIUM CHLORIDE 0.9 % IJ SOLN: 3.0000 mL | INTRAMUSCULAR | Status: DC | PRN

## 2013-11-15 MED ORDER — IBUPROFEN 600 MG PO TABS: 600.0000 mg | ORAL_TABLET | Freq: Four times a day (QID) | ORAL | Status: DC | PRN

## 2013-11-15 MED ORDER — HYDROCODONE-ACETAMINOPHEN 5-325 MG PO TABS: 1.0000 | ORAL_TABLET | ORAL | Status: DC | PRN

## 2013-11-15 MED ORDER — POLYETHYLENE GLYCOL 3350 17 G PO PACK
17.0000 g | PACK | Freq: Two times a day (BID) | ORAL | Status: DC
  Administered 2013-11-15: 17 g via ORAL

## 2013-11-15 MED ORDER — BISACODYL 10 MG RE SUPP
10.0000 mg | Freq: Two times a day (BID) | RECTAL | Status: DC | PRN
Start: 2013-11-15 — End: 2013-11-15

## 2013-11-15 NOTE — Discharge Summary (Signed)
Physician Discharge Summary  Patient ID: Tyler Smith MRN: [id number] DOB/AGE: [birth year] 53 y.o.  Admit date: 11/09/2013 Discharge date: 11/15/2013  Patient has no care team.  Admission Diagnoses: Principal Problem:   Cystic mass of pancreas   Discharge Diagnoses:  Principal Problem:   Cystic mass of pancreas   POST-OPERATIVE DIAGNOSIS:  PANCREATIC CYST   SURGERY:  Procedure(s): ROBOTIC DISTAL PANCREATECTOMY  SURGEON:  Surgeon(s): Stark Klein, MD Ralene Ok, MD Kaylyn Lim, MD  Consults:   Hospital Course:   The patient underwent the surgery above.  Postoperatively, the patient gradually mobilized and advanced to a solid diet.  Pain and other symptoms were treated aggressively.  He did struggled with a mild ileus and low-grade fevers.  That resolved.  By the time of discharge, the patient was walking well the hallways, eating food, having flatus.  Moving around easily in his room, quite comfortable.  Pain was well-controlled on an oral medications.  Based on meeting discharge criteria and continuing to recover, I felt it was safe for the patient to be discharged from the hospital to further recover with close followup. Drain output had tapered off and was serosanguineous.  Therefore removed on day of discharge.  Postoperative recommendations were discussed in detail.  They are written as well.   Significant Diagnostic Studies:  Results for orders placed during the hospital encounter of 11/09/13 (from the past 72 hour(s))  CBC     Status: Abnormal   Collection Time    11/13/13  4:55 AM      Result Value Ref Range   WBC 11.0 (*) 4.0 - 10.5 K/uL   RBC 4.35  4.22 - 5.81 MIL/uL   Hemoglobin 12.4 (*) 13.0 - 17.0 g/dL   HCT 36.8 (*) 39.0 - 52.0 %   MCV 84.6  78.0 - 100.0 fL   MCH 28.5  26.0 - 34.0 pg   MCHC 33.7  30.0 - 36.0 g/dL   RDW 11.9  11.5 - 15.5 %   Platelets 244  150 - 400 K/uL  CBC     Status: Abnormal   Collection Time    11/14/13  5:20 AM      Result  Value Ref Range   WBC 10.3  4.0 - 10.5 K/uL   RBC 4.47  4.22 - 5.81 MIL/uL   Hemoglobin 12.8 (*) 13.0 - 17.0 g/dL   HCT 37.9 (*) 39.0 - 52.0 %   MCV 84.8  78.0 - 100.0 fL   MCH 28.6  26.0 - 34.0 pg   MCHC 33.8  30.0 - 36.0 g/dL   RDW 12.1  11.5 - 15.5 %   Platelets 287  150 - 400 K/uL  BASIC METABOLIC PANEL     Status: Abnormal   Collection Time    11/14/13  5:20 AM      Result Value Ref Range   Sodium 136 (*) 137 - 147 mEq/L   Potassium 3.7  3.7 - 5.3 mEq/L   Chloride 98  96 - 112 mEq/L   CO2 26  19 - 32 mEq/L   Glucose, Bld 126 (*) 70 - 99 mg/dL   BUN 7  6 - 23 mg/dL   Creatinine, Ser 0.76  0.50 - 1.35 mg/dL   Calcium 8.4  8.4 - 10.5 mg/dL   GFR calc non Af Amer >90  >90 mL/min   GFR calc Af Amer >90  >90 mL/min   Comment: (NOTE)     The eGFR has been calculated using the  CKD EPI equation.     This calculation has not been validated in all clinical situations.     eGFR's persistently <90 mL/min signify possible Chronic Kidney     Disease.   Anion gap 12  5 - 15    No results found.  Discharge Exam: Blood pressure 122/78, pulse 81, temperature 98.2 F (36.8 C), temperature source Oral, resp. rate 19, height 5' 11"  (1.803 m), weight 228 lb 4 oz (103.534 kg), SpO2 98.00%.  General: Pt awake/alert/oriented x4 in no major acute distress.  Smiling, calm.  Moves from chair quite easily Eyes: PERRL, normal EOM. Sclera nonicteric Neuro: CN II-XII intact w/o focal sensory/motor deficits. Lymph: No head/neck/groin lymphadenopathy Psych:  No delerium/psychosis/paranoia HENT: Normocephalic, Mucus membranes moist.  No thrush Neck: Supple, No tracheal deviation Chest: No pain.  Good respiratory excursion. CV:  Pulses intact.  Regular rhythm MS: Normal AROM mjr joints.  No obvious deformity Abdomen: Soft, Nondistended.  Min tender at lap incisions.  Drain thinly serosanguinous.  No incarcerated hernias. Ext:  SCDs BLE.  No significant edema.  No cyanosis Skin: No petechiae /  purpura  Discharged Condition: good   Past Medical History  Diagnosis Date  . Thyroid disease   . Hypercholesterolemia   . GERD (gastroesophageal reflux disease)     none recent  . Hypothyroidism     Past Surgical History  Procedure Laterality Date  . Eye surgery Bilateral age 53    lazy eye  . Pinkie finger surgfery Right yrs ago  . Eus N/A 09/01/2013    Procedure: ESOPHAGEAL ENDOSCOPIC ULTRASOUND (EUS) RADIAL;  Surgeon: Arta Silence, MD;  Location: [facility];  Service: Endoscopy;  Laterality: N/A;  . Fine needle aspiration N/A 09/01/2013    Procedure: FINE NEEDLE ASPIRATION (FNA) LINEAR;  Surgeon: Arta Silence, MD;  Location: [facility];  Service: Endoscopy;  Laterality: N/A;    History   Social History  . Marital Status: Unknown    Spouse Name: N/A    Number of Children: N/A  . Years of Education: N/A   Occupational History  . Not on file.   Social History Main Topics  . Smoking status: Never Smoker   . Smokeless tobacco: Never Used  . Alcohol Use: Yes     Comment: occasional  . Drug Use: No  . Sexual Activity: Yes   Other Topics Concern  . Not on file   Social History Narrative  . No narrative on file    Family History  Problem Relation Age of Onset  . Cancer - Other Mother   . COPD Father   . Coronary artery disease Brother   . Coronary artery disease Brother     Current Facility-Administered Medications  Medication Dose Route Frequency Provider Last Rate Last Dose  . 0.9 %  sodium chloride infusion  250 mL Intravenous PRN Michael Boston, MD      . alum & mag hydroxide-simeth (MAALOX/MYLANTA) 200-200-20 MG/5ML suspension 30 mL  30 mL Oral Q6H PRN Michael Boston, MD      . bisacodyl (DULCOLAX) suppository 10 mg  10 mg Rectal Q12H PRN Michael Boston, MD      . dextrose 5 % and 0.45 % NaCl with KCl 20 mEq/L infusion   Intravenous Continuous Armandina Gemma, MD 50 mL/hr at 11/14/13 0700    . enoxaparin (LOVENOX) injection 50 mg  50 mg Subcutaneous Q24H  Stark Klein, MD   50 mg at 11/14/13 0956  . HYDROcodone-acetaminophen (NORCO/VICODIN) 5-325 MG per tablet 1-2  tablet  1-2 tablet Oral Q4H PRN Armandina Gemma, MD   1 tablet at 11/14/13 2344  . levothyroxine (SYNTHROID, LEVOTHROID) tablet 100 mcg  100 mcg Oral QAC breakfast Armandina Gemma, MD   100 mcg at 11/14/13 5277  . lip balm (CARMEX) ointment 1 application  1 application Topical BID Michael Boston, MD      . magic mouthwash  15 mL Oral QID PRN Michael Boston, MD      . ondansetron [facility]) tablet 4 mg  4 mg Oral Q6H PRN Stark Klein, MD   4 mg at 11/10/13 1506   Or  . ondansetron (ZOFRAN) injection 4 mg  4 mg Intravenous Q6H PRN Stark Klein, MD   4 mg at 11/10/13 1802  . polyethylene glycol (MIRALAX / GLYCOLAX) packet 17 g  17 g Oral BID Michael Boston, MD      . polyethylene glycol (MIRALAX / GLYCOLAX) packet 17 g  17 g Oral Q12H PRN Michael Boston, MD      . sodium chloride 0.9 % injection 3 mL  3 mL Intravenous Q12H Michael Boston, MD      . sodium chloride 0.9 % injection 3 mL  3 mL Intravenous PRN Michael Boston, MD         Allergies  Allergen Reactions  . Penicillins Hives    Disposition: 01-Home or Self Care  Discharge Instructions   Call MD for:  extreme fatigue    Complete by:  As directed      Call MD for:  hives    Complete by:  As directed      Call MD for:  persistant nausea and vomiting    Complete by:  As directed      Call MD for:  redness, tenderness, or signs of infection (pain, swelling, redness, odor or green/yellow discharge around incision site)    Complete by:  As directed      Call MD for:  severe uncontrolled pain    Complete by:  As directed      Call MD for:    Complete by:  As directed   Temperature > 101.51F     Diet - low sodium heart healthy    Complete by:  As directed      Discharge instructions    Complete by:  As directed   Please see discharge instruction sheets.  Also refer to handout given an office.  Please call our office if you have any questions or  concerns [phone]     Discharge wound care:    Complete by:  As directed   If you have closed incisions, shower and bathe over these incisions with soap and water every day.  Remove all surgical dressings on postoperative day #3.  You do not need to replace dressings over the closed incisions unless you feel more comfortable with a Band-Aid covering it.   If you have an open wound that requires packing, please see wound care instructions.  In general, remove all dressings, wash wound with soap and water and then replace with saline moistened gauze.  Do the dressing change at least every day.  Please call our office [phone] if you have further questions.     Driving Restrictions    Complete by:  As directed   No driving until off narcotics and can safely swerve away without pain during an emergency     Increase activity slowly    Complete by:  As directed   Walk an hour  a day.  Use 20-30 minute walks.  When you can walk 30 minutes without difficulty, increase to low impact/moderate activities such as biking, jogging, swimming, sexual activity..  Eventually can increase to unrestricted activity when not feeling pain.  If you feel pain: STOP!Marland Kitchen   Let pain protect you from overdoing it.  Use ice/heat/over-the-counter pain medications to help minimize his soreness.  Use pain prescriptions as needed to remain active.  It is better to take extra pain medications and be more active than to stay bedridden to avoid all pain medications.     Lifting restrictions    Complete by:  As directed   Avoid heavy lifting initially.  Do not push through pain.  You have no specific weight limit.  Coughing and sneezing or four more stressful to your incision than any lifting you will do. Pain will protect you from injury.  Therefore, avoid intense activity until off all narcotic pain medications.  Coughing and sneezing or four more stressful to your incision than any lifting he will do.     May shower / Bathe     Complete by:  As directed      May walk up steps    Complete by:  As directed      Sexual Activity Restrictions    Complete by:  As directed   Sexual activity as tolerated.  Do not push through pain.  Pain will protect you from injury.     Walk with assistance    Complete by:  As directed   Walk over an hour a day.  May use a walker/cane/companion to help with balance and stamina.            Medication List         acetaminophen 325 MG tablet  Commonly known as:  TYLENOL  Take 650 mg by mouth every 6 (six) hours as needed.     aspirin EC 81 MG tablet  Take 81 mg by mouth at bedtime.     HYDROcodone-acetaminophen 5-325 MG per tablet  Commonly known as:  NORCO/VICODIN  Take 1-2 tablets by mouth every 4 (four) hours as needed for moderate pain or severe pain.     ibuprofen 600 MG tablet  Commonly known as:  ADVIL,MOTRIN  Take 1 tablet (600 mg total) by mouth every 6 (six) hours as needed for fever, headache, mild pain or moderate pain.     levothyroxine 112 MCG tablet  Commonly known as:  SYNTHROID, LEVOTHROID  Take 112 mcg by mouth daily before breakfast.     pravastatin 40 MG tablet  Commonly known as:  PRAVACHOL  Take 40 mg by mouth at bedtime.           Follow-up Information   [facility]. Schedule an appointment as soon as possible for a visit in 3 weeks. (To follow up after your operation, To follow up after your hospital stay)    Specialty:  General Surgery   Contact information:   [place] [address] [phone]        Signed: Morton Peters, M.D., F.A.C.S. Gastrointestinal and Minimally Invasive Surgery [facility] [address] [place], [place] [address] [phone] Main / Paging   11/15/2013, 7:28 AM

## 2013-11-15 NOTE — Discharge Instructions (Signed)
ABDOMINAL SURGERY: POST OP INSTRUCTIONS ° °1. DIET: Follow a light bland diet the first 24 hours after arrival home, such as soup, liquids, crackers, etc.  Be sure to include lots of fluids daily.  Avoid fast food or heavy meals as your are more likely to get nauseated.  Eat a low fat the next few days after surgery.   °2. Take your usually prescribed home medications unless otherwise directed. °3. PAIN CONTROL: °a. Pain is [place] controlled by a usual combination of three different methods TOGETHER: °i. Ice/Heat °ii. Over the counter pain medication °iii. Prescription pain medication °b. Most patients will experience some swelling and bruising around the incisions.  Ice packs or heating pads (30-60 minutes up to 6 times a day) will help. Use ice for the first few days to help decrease swelling and bruising, then switch to heat to help relax tight/sore spots and speed recovery.  Some people prefer to use ice alone, heat alone, alternating between ice & heat.  Experiment to what works for you.  Swelling and bruising can take several weeks to resolve.   °c. It is helpful to take an over-the-counter pain medication regularly for the first few weeks.  Choose one of the following that works [place] for you: °i. Naproxen (Aleve, etc)  Two 220mg tabs twice a day °ii. Ibuprofen (Advil, etc) Three 200mg tabs four times a day (every meal & bedtime) °iii. Acetaminophen (Tylenol, etc) 500-650mg four times a day (every meal & bedtime) °d. A  prescription for pain medication (such as oxycodone, hydrocodone, etc) should be given to you upon discharge.  Take your pain medication as prescribed.  °i. If you are having problems/concerns with the prescription medicine (does not control pain, nausea, vomiting, rash, itching, etc), please call us [phone] to see if we need to switch you to a different pain medicine that will work better for you and/or control your side effect better. °ii. If you need a refill on your pain medication,  please contact your pharmacy.  They will contact our office to request authorization. Prescriptions will not be filled after 5 pm or on week-ends. °4. Avoid getting constipated.  Between the surgery and the pain medications, it is common to experience some constipation.  Increasing fluid intake and taking a fiber supplement (such as Metamucil, Citrucel, FiberCon, MiraLax, etc) 1-2 times a day regularly will usually help prevent this problem from occurring.  A mild laxative (prune juice, Milk of Magnesia, MiraLax, etc) should be taken according to package directions if there are no bowel movements after 48 hours.   °5. Watch out for diarrhea.  If you have many loose bowel movements, simplify your diet to bland foods & liquids for a few days.  Stop any stool softeners and decrease your fiber supplement.  Switching to mild anti-diarrheal medications (Kayopectate, Pepto Bismol) can help.  If this worsens or does not improve, please call us. °6. Wash / shower every day.  You may shower over the incision / wound.  Avoid baths until the skin is fully healed.  Continue to shower over incision(s) after the dressing is off. °7. Remove your waterproof bandages 5 days after surgery.  You may leave the incision open to air.  You may replace a dressing/Band-Aid to cover the incision for comfort if you wish. °8. ACTIVITIES as tolerated:   °a. You may resume regular (light) daily activities beginning the next day--such as daily self-care, walking, climbing stairs--gradually increasing activities as tolerated.  If you can   walk 30 minutes without difficulty, it is safe to try more intense activity such as jogging, treadmill, bicycling, low-impact aerobics, swimming, etc. °b. Save the most intensive and strenuous activity for last such as sit-ups, heavy lifting, contact sports, etc  Refrain from any heavy lifting or straining until you are off narcotics for pain control.   °c. DO NOT PUSH THROUGH PAIN.  Let pain be your guide: If it  hurts to do something, don't do it.  Pain is your body warning you to avoid that activity for another week until the pain goes down. °d. You may drive when you are no longer taking prescription pain medication, you can comfortably wear a seatbelt, and you can safely maneuver your car and apply brakes. °e. You may have sexual intercourse when it is comfortable.  °9. FOLLOW UP in our office °a. Please call [facility] at [phone] to set up an appointment to see your surgeon in the office for a follow-up appointment approximately 1-2 weeks after your surgery. °b. Make sure that you call for this appointment the day you arrive home to insure a convenient appointment time. °10. IF YOU HAVE DISABILITY OR FAMILY LEAVE FORMS, BRING THEM TO THE OFFICE FOR PROCESSING.  DO NOT GIVE THEM TO YOUR DOCTOR. ° ° °WHEN TO CALL US [phone]: °1. Poor pain control °2. Reactions / problems with new medications (rash/itching, nausea, etc)  °3. Fever over 101.5 F (38.5 C) °4. Inability to urinate °5. Nausea and/or vomiting °6. Worsening swelling or bruising °7. Continued bleeding from incision. °8. Increased pain, redness, or drainage from the incision ° °The clinic staff is available to answer your questions during regular business hours (8:30am-5pm).  Please don’t hesitate to call and ask to speak to one of our nurses for clinical concerns.   A surgeon from [facility] is always on call at the hospitals °  °If you have a medical emergency, go to the nearest emergency room or call 911. °  ° °[facility] °[address], [address], [place], [place]  [address] ? °MAIN: [phone] ? TOLL FREE: [phone] ? °FAX [phone] °[email or web address] ° °GETTING TO GOOD BOWEL HEALTH. °Irregular bowel habits such as constipation and diarrhea can lead to many problems over time.  Having one soft bowel movement a day is the most important way to prevent further problems.  The anorectal canal  is designed to handle stretching and feces to safely manage our ability to get rid of solid waste (feces, poop, stool) out of our body.  BUT, hard constipated stools can act like ripping concrete bricks and diarrhea can be a burning fire to this very sensitive area of our body, causing inflamed hemorrhoids, anal fissures, increasing risk is perirectal abscesses, abdominal pain/bloating, an making irritable bowel worse.     °The goal: ONE SOFT BOWEL MOVEMENT A DAY!  To have soft, regular bowel movements:  °  Drink at least 8 tall glasses of water a day.   °  Take plenty of fiber.  Fiber is the undigested part of plant food that passes into the colon, acting s “natures broom” to encourage bowel motility and movement.  Fiber can absorb and hold large amounts of water. This results in a larger, bulkier stool, which is soft and easier to pass. Work gradually over several weeks up to 6 servings a day of fiber (25g a day even more if needed) in the form of: °o Vegetables -- Root (potatoes, carrots, turnips),   leafy green (lettuce, salad greens, celery, spinach), or cooked high residue (cabbage, broccoli, etc) °o Fruit -- Fresh (unpeeled skin & pulp), Dried (prunes, apricots, cherries, etc ),  or stewed ( applesauce)  °o Whole grain breads, pasta, etc (whole wheat)  °o Bran cereals  °  Bulking Agents -- This type of water-retaining fiber generally is easily obtained each day by one of the following:  °o Psyllium bran -- The psyllium plant is remarkable because its ground seeds can retain so much water. This product is available as Metamucil, Konsyl, Effersyllium, Per Diem Fiber, or the less expensive generic preparation in drug and health food stores. Although labeled a laxative, it really is not a laxative.  °o Methylcellulose -- This is another fiber derived from wood which also retains water. It is available as Citrucel. °o Polyethylene Glycol - and “artificial” fiber commonly called Miralax or Glycolax.  It is helpful  for people with gassy or bloated feelings with regular fiber °o Flax Seed - a less gassy fiber than psyllium °  No reading or other relaxing activity while on the toilet. If bowel movements take longer than 5 minutes, you are too constipated °  AVOID CONSTIPATION.  High fiber and water intake usually takes care of this.  Sometimes a laxative is needed to stimulate more frequent bowel movements, but  °  Laxatives are not a good long-term solution as it can wear the colon out. °o Osmotics (Milk of Magnesia, Fleets phosphosoda, Magnesium citrate, MiraLax, GoLytely) are safer than  °o Stimulants (Senokot, Castor Oil, Dulcolax, Ex Lax)    °o Do not take laxatives for more than 7days in a row. °   IF SEVERELY CONSTIPATED, try a Bowel Retraining Program: °o Do not use laxatives.  °o Eat a diet high in roughage, such as bran cereals and leafy vegetables.  °o Drink six (6) ounces of prune or apricot juice each morning.  °o Eat two (2) large servings of stewed fruit each day.  °o Take one (1) heaping tablespoon of a psyllium-based bulking agent twice a day. Use sugar-free sweetener when possible to avoid excessive calories.  °o Eat a normal breakfast.  °o Set aside 15 minutes after breakfast to sit on the toilet, but do not strain to have a bowel movement.  °o If you do not have a bowel movement by the third day, use an enema and repeat the above steps.  °  Controlling diarrhea °o Switch to liquids and simpler foods for a few days to avoid stressing your intestines further. °o Avoid dairy products (especially milk & ice cream) for a short time.  The intestines often can lose the ability to digest lactose when stressed. °o Avoid foods that cause gassiness or bloating.  Typical foods include beans and other legumes, cabbage, broccoli, and dairy foods.  Every person has some sensitivity to other foods, so listen to our body and avoid those foods that trigger problems for you. °o Adding fiber (Citrucel, Metamucil, psyllium,  Miralax) gradually can help thicken stools by absorbing excess fluid and retrain the intestines to act more normally.  Slowly increase the dose over a few weeks.  Too much fiber too soon can backfire and cause cramping & bloating. °o Probiotics (such as active yogurt, Align, etc) may help repopulate the intestines and colon with normal bacteria and calm down a sensitive digestive tract.  Most studies show it to be of mild help, though, and such products can be costly. °o Medicines: °  Bismuth   subsalicylate (ex. Kayopectate, Pepto Bismol) every 30 minutes for up to 6 doses can help control diarrhea.  Avoid if pregnant.   Loperamide (Immodium) can slow down diarrhea.  Start with two tablets (4mg  total) first and then try one tablet every 6 hours.  Avoid if you are having fevers or severe pain.  If you are not better or start feeling worse, stop all medicines and call your doctor for advice o Call your doctor if you are getting worse or not better.  Sometimes further testing (cultures, endoscopy, X-ray studies, bloodwork, etc) may be needed to help diagnose and treat the cause of the diarrhea.  Managing Pain  Pain after surgery or related to activity is often due to strain/injury to muscle, tendon, nerves and/or incisions.  This pain is usually short-term and will improve in a few months.   Many people find it helpful to do the following things TOGETHER to help speed the process of healing and to get back to regular activity more quickly:  1. Avoid heavy physical activity a.  no lifting greater than 20 pounds b. Do not push through the pain.  Listen to your body and avoid positions and maneuvers than reproduce the pain c. Walking is okay as tolerated, but go slowly and stop when getting sore.  d. Remember: If it hurts to do it, then dont do it! 2. Take Anti-inflammatory medication  a. Take with food/snack around the clock for 1-2 weeks i. This helps the muscle and nerve tissues become less irritable  and calm down faster b. Choose ONE of the following over-the-counter medications: i. Naproxen 220mg  tabs (ex. Aleve) 1-2 pills twice a day  ii. Ibuprofen 200mg  tabs (ex. Advil, Motrin) 3-4 pills with every meal and just before bedtime iii. Acetaminophen 500mg  tabs (Tylenol) 1-2 pills with every meal and just before bedtime 3. Use a Heating pad or Ice/Cold Pack a. 4-6 times a day b. May use warm bath/hottub  or showers 4. Try Gentle Massage and/or Stretching  a. at the area of pain many times a day b. stop if you feel pain - do not overdo it  Try these steps together to help you body heal faster and avoid making things get worse.  Doing just one of these things may not be enough.    If you are not getting better after two weeks or are noticing you are getting worse, contact our office for further advice; we may need to re-evaluate you & see what other things we can do to help.  Low-Fat Diet for Pancreatitis or Gallbladder Conditions A low-fat diet can be helpful if you have pancreatitis or a gallbladder condition. With these conditions, your pancreas and gallbladder have trouble digesting fats. A healthy eating plan with less fat will help rest your pancreas and gallbladder and reduce your symptoms. WHAT DO I NEED TO KNOW ABOUT THIS DIET?  Eat a low-fat diet.  Reduce your fat intake to less than 20-30% of your total daily calories. This is less than 50-60 g of fat per day.  Remember that you need some fat in your diet. Ask your dietician what your daily goal should be.  Choose nonfat and low-fat healthy foods. Look for the words "nonfat," "low fat," or "fat free."  As a guide, look on the label and choose foods with less than 3 g of fat per serving. Eat only one serving.  Avoid alcohol.  Do not smoke. If you need help quitting, talk with your health care  provider.  Eat small frequent meals instead of three large heavy meals. WHAT FOODS CAN I EAT? Grains Include healthy grains and  starches such as potatoes, wheat bread, fiber-rich cereal, and brown rice. Choose whole grain options whenever possible. In adults, whole grains should account for 45-65% of your daily calories.  Fruits and Vegetables Eat plenty of fruits and vegetables. Fresh fruits and vegetables add fiber to your diet. Meats and Other Protein Sources Eat lean meat such as chicken and pork. Trim any fat off of meat before cooking it. Eggs, fish, and beans are other sources of protein. In adults, these foods should account for 10-35% of your daily calories. Dairy Choose low-fat milk and dairy options. Dairy includes fat and protein, as well as calcium.  Fats and Oils Limit high-fat foods such as fried foods, sweets, baked goods, sugary drinks.  Other Creamy sauces and condiments, such as mayonnaise, can add extra fat. Think about whether or not you need to use them, or use smaller amounts or low fat options. WHAT FOODS ARE NOT RECOMMENDED?  High fat foods, such as:  [employer].  Ice cream.  [place] toast.  Sweet rolls.  Pizza.  Cheese bread.  Foods covered with batter, butter, creamy sauces, or cheese.  Fried foods.  Sugary drinks and desserts.  Foods that cause gas or bloating Document Released: 02/02/2013 Document Reviewed: 02/02/2013 [facility] Patient Information 2015 [place], [place]. This information is not intended to replace advice given to you by your health care provider. Make sure you discuss any questions you have with your health care provider.

## 2013-11-18 ENCOUNTER — Emergency Department (HOSPITAL_COMMUNITY): Payer: No Typology Code available for payment source

## 2013-11-18 ENCOUNTER — Emergency Department (HOSPITAL_COMMUNITY)
Admission: EM | Admit: 2013-11-18 | Discharge: 2013-11-19 | Disposition: A | Discharge status: Home / Self Care | Payer: No Typology Code available for payment source | Attending: General Surgery | Admitting: General Surgery

## 2013-11-18 ENCOUNTER — Encounter (HOSPITAL_COMMUNITY): Payer: Self-pay | Admitting: Emergency Medicine

## 2013-11-18 DIAGNOSIS — R509 Fever, unspecified: Secondary | ICD-10-CM | POA: Insufficient documentation

## 2013-11-18 DIAGNOSIS — E780 Pure hypercholesterolemia: Secondary | ICD-10-CM | POA: Diagnosis not present

## 2013-11-18 DIAGNOSIS — Z7982 Long term (current) use of aspirin: Secondary | ICD-10-CM | POA: Insufficient documentation

## 2013-11-18 DIAGNOSIS — Y838 Other surgical procedures as the cause of abnormal reaction of the patient, or of later complication, without mention of misadventure at the time of the procedure: Secondary | ICD-10-CM | POA: Diagnosis not present

## 2013-11-18 DIAGNOSIS — Z8719 Personal history of other diseases of the digestive system: Secondary | ICD-10-CM | POA: Insufficient documentation

## 2013-11-18 DIAGNOSIS — Z880 Allergy status to penicillin: Secondary | ICD-10-CM | POA: Insufficient documentation

## 2013-11-18 DIAGNOSIS — E039 Hypothyroidism, unspecified: Secondary | ICD-10-CM | POA: Diagnosis not present

## 2013-11-18 DIAGNOSIS — E079 Disorder of thyroid, unspecified: Secondary | ICD-10-CM | POA: Diagnosis not present

## 2013-11-18 DIAGNOSIS — T888XXA Other specified complications of surgical and medical care, not elsewhere classified, initial encounter: Secondary | ICD-10-CM | POA: Insufficient documentation

## 2013-11-18 DIAGNOSIS — IMO0001 Reserved for inherently not codable concepts without codable children: Secondary | ICD-10-CM

## 2013-11-18 DIAGNOSIS — T792XXA Traumatic secondary and recurrent hemorrhage and seroma, initial encounter: Secondary | ICD-10-CM

## 2013-11-18 DIAGNOSIS — Z79899 Other long term (current) drug therapy: Secondary | ICD-10-CM | POA: Diagnosis not present

## 2013-11-18 DIAGNOSIS — G8918 Other acute postprocedural pain: Secondary | ICD-10-CM

## 2013-11-18 DIAGNOSIS — R0602 Shortness of breath: Secondary | ICD-10-CM | POA: Diagnosis present

## 2013-11-18 HISTORY — DX: Cyst of pancreas: K86.2

## 2013-11-18 LAB — CBC
HCT: 37.5 % — ABNORMAL LOW (ref 39.0–52.0)
Hemoglobin: 12.8 g/dL — ABNORMAL LOW (ref 13.0–17.0)
MCH: 28.6 pg (ref 26.0–34.0)
MCHC: 34.1 g/dL (ref 30.0–36.0)
MCV: 83.9 fL (ref 78.0–100.0)
PLATELETS: 429 10*3/uL — AB (ref 150–400)
RBC: 4.47 MIL/uL (ref 4.22–5.81)
RDW: 12.1 % (ref 11.5–15.5)
WBC: 13.6 10*3/uL — AB (ref 4.0–10.5)

## 2013-11-18 LAB — COMPREHENSIVE METABOLIC PANEL
ALK PHOS: 92 U/L (ref 39–117)
ALT: 76 U/L — AB (ref 0–53)
AST: 41 U/L — ABNORMAL HIGH (ref 0–37)
Albumin: 3.1 g/dL — ABNORMAL LOW (ref 3.5–5.2)
Anion gap: 15 (ref 5–15)
BUN: 7 mg/dL (ref 6–23)
CALCIUM: 8.8 mg/dL (ref 8.4–10.5)
CO2: 25 mEq/L (ref 19–32)
Chloride: 95 mEq/L — ABNORMAL LOW (ref 96–112)
Creatinine, Ser: 0.86 mg/dL (ref 0.50–1.35)
GFR calc Af Amer: >90 mL/min (ref >90)
Glucose, Bld: 116 mg/dL — ABNORMAL HIGH (ref 70–99)
POTASSIUM: 3.8 meq/L (ref 3.7–5.3)
SODIUM: 135 meq/L — AB (ref 137–147)
Total Bilirubin: 0.6 mg/dL (ref 0.3–1.2)
Total Protein: 7.1 g/dL (ref 6.0–8.3)

## 2013-11-18 LAB — PRO B NATRIURETIC PEPTIDE: Pro B Natriuretic peptide (BNP): 28.5 pg/mL (ref 0–125)

## 2013-11-18 MED ORDER — IOHEXOL 300 MG/ML  SOLN
80.0000 mL | Freq: Once | INTRAMUSCULAR | Status: AC | PRN
  Administered 2013-11-18: 80 mL via INTRAVENOUS

## 2013-11-18 MED ORDER — IOHEXOL 350 MG/ML SOLN
100.0000 mL | Freq: Once | INTRAVENOUS | Status: AC | PRN
  Administered 2013-11-18: 100 mL via INTRAVENOUS

## 2013-11-18 NOTE — Progress Notes (Signed)
  CARE MANAGEMENT ED NOTE 11/18/2013  Patient:  Darcus PesterBEST,Sisto L   Account Number:  [id number]  Date Initiated:  11/18/2013  Documentation initiated by:  Radford PaxFERRERO,Annistyn Depass  Subjective/Objective Assessment:   Patient presents to Ed with shortness of breath ever since his laprocscopic surgery.     Subjective/Objective Assessment Detail:     Action/Plan:   Action/Plan Detail:   Anticipated DC Date:       Status Recommendation to Physician:   Result of Recommendation:    Other ED Services  Consult Working Plan    DC Planning Services  Other  PCP issues    Choice offered to / List presented to:            Status of service:  Completed, signed off  ED Comments:   ED Comments Detail:  EDCM spoke to patient at bedside.  Patient confirms his pcp is Dr. Lupe Carneyean Mitchell.  System updated.

## 2013-11-18 NOTE — ED Notes (Signed)
Bed: WA23 Expected date:  Expected time:  Means of arrival:  Comments: hold 

## 2013-11-18 NOTE — ED Provider Notes (Signed)
CSN: [id number]     Arrival date & time 11/18/13  1728 History   First MD Initiated Contact with Patient 11/18/13 1751     Chief Complaint  Patient presents with  . Shortness of Breath    Patient is a 53 y.o. male presenting with shortness of breath. The history is provided by the patient.  Shortness of Breath Severity:  Moderate Onset quality:  Gradual Duration:  1 day Timing:  Constant Progression:  Worsening Chronicity:  New Context comment:  He feels that is more that he cant take a deep breath rather than feeling short of breath.  He feels like he will have to cough and his breathing is more shallow than usual. Relieved by:  Nothing Worsened by:  Activity (When he tries to walk he does get short of breath.) Associated symptoms: fever (100.? at the office)   Associated symptoms: no abdominal pain     Past Medical History  Diagnosis Date  . Thyroid disease   . Hypercholesterolemia   . GERD (gastroesophageal reflux disease)     none recent  . Hypothyroidism   . Pancreatic cyst    Past Surgical History  Procedure Laterality Date  . Eye surgery Bilateral age 76    lazy eye  . Pinkie finger surgfery Right yrs ago  . Eus N/A 09/01/2013    Procedure: ESOPHAGEAL ENDOSCOPIC ULTRASOUND (EUS) RADIAL;  Surgeon: Willis ModenaWilliam Outlaw, MD;  Location: [facility];  Service: Endoscopy;  Laterality: N/A;  . Fine needle aspiration N/A 09/01/2013    Procedure: FINE NEEDLE ASPIRATION (FNA) LINEAR;  Surgeon: Willis ModenaWilliam Outlaw, MD;  Location: [facility];  Service: Endoscopy;  Laterality: N/A;   Family History  Problem Relation Age of Onset  . Cancer - Other Mother   . COPD Father   . Coronary artery disease Brother   . Coronary artery disease Brother    History  Substance Use Topics  . Smoking status: Never Smoker   . Smokeless tobacco: Never Used  . Alcohol Use: Yes     Comment: occasional    Review of Systems  Constitutional: Positive for fever (100.? at the office).  Respiratory:  Positive for shortness of breath.   Gastrointestinal: Negative for abdominal pain.  All other systems reviewed and are negative.     Allergies  Penicillins  Home Medications   Prior to Admission medications   Medication Sig Start Date End Date Taking? Authorizing Provider  acetaminophen (TYLENOL) 325 MG tablet Take 325 mg by mouth daily as needed for moderate pain.    Yes Historical Provider, MD  aspirin EC 81 MG tablet Take 81 mg by mouth at bedtime.    Yes Historical Provider, MD  HYDROcodone-acetaminophen (NORCO/VICODIN) 5-325 MG per tablet Take 1-2 tablets by mouth every 4 (four) hours as needed for moderate pain or severe pain. 11/15/13  Yes Karie SodaSteven Gross, MD  ibuprofen (ADVIL,MOTRIN) 600 MG tablet Take 1 tablet (600 mg total) by mouth every 6 (six) hours as needed for fever, headache, mild pain or moderate pain. 11/15/13  Yes Karie SodaSteven Gross, MD  levothyroxine (SYNTHROID, LEVOTHROID) 112 MCG tablet Take 112 mcg by mouth daily before breakfast.   Yes Historical Provider, MD  pravastatin (PRAVACHOL) 40 MG tablet Take 40 mg by mouth at bedtime.    Yes Historical Provider, MD   BP 134/85  Pulse 93  Temp(Src) 99.9 F (37.7 C) (Oral)  Resp 20  SpO2 95% Physical Exam  Nursing note and vitals reviewed. Constitutional: He appears well-developed and well-nourished. No  distress.  HENT:  Head: Normocephalic and atraumatic.  Right Ear: External ear normal.  Left Ear: External ear normal.  Eyes: Conjunctivae are normal. Right eye exhibits no discharge. Left eye exhibits no discharge. No scleral icterus.  Neck: Neck supple. No tracheal deviation present.  Cardiovascular: Normal rate, regular rhythm and intact distal pulses.   Pulmonary/Chest: Effort normal. No stridor. No respiratory distress. He has no wheezes. He has rales (few, bilateral bases).  Abdominal: Soft. Bowel sounds are normal. He exhibits no distension. There is no tenderness. There is no rebound and no guarding.  Well healed  surgical scars in abdomen, serous drainage on dressing in LLQ  Musculoskeletal: He exhibits no edema and no tenderness.  Neurological: He is alert. He has normal strength. No cranial nerve deficit (no facial droop, extraocular movements intact, no slurred speech) or sensory deficit. He exhibits normal muscle tone. He displays no seizure activity. Coordination normal.  Skin: Skin is warm and dry. No rash noted.  Psychiatric: He has a normal mood and affect.    ED Course  Procedures (including critical care time) Labs Review Labs Reviewed  CBC - Abnormal; Notable for the following:    WBC 13.6 (*)    Hemoglobin 12.8 (*)    HCT 37.5 (*)    Platelets 429 (*)    All other components within normal limits  COMPREHENSIVE METABOLIC PANEL - Abnormal; Notable for the following:    Sodium 135 (*)    Chloride 95 (*)    Glucose, Bld 116 (*)    Albumin 3.1 (*)    AST 41 (*)    ALT 76 (*)    All other components within normal limits  PRO B NATRIURETIC PEPTIDE    Imaging Review Dg Chest 2 View  11/18/2013   CLINICAL DATA:  Shortness of breath for 2 days. Dry cough since March.  EXAM: CHEST  2 VIEW  COMPARISON:  07/23/2013 and CT of 07/29/2013  FINDINGS: Midline trachea. Heart size upper normal. Mediastinal contours otherwise within normal limits. Small left pleural effusion. No pneumothorax. Bibasilar volume loss with patchy airspace disease, greater on the left.  IMPRESSION: Small left pleural effusion. Bibasilar opacities. Likely atelectasis. At the left lung base, infection cannot be excluded.   Electronically Signed   By: Jeronimo Greaves M.D.   On: 11/18/2013 18:38   Ct Angio Chest W/cm &/or Wo Cm  11/18/2013   CLINICAL DATA:  Postoperative chest and abdominal pain.  EXAM: CT ANGIOGRAPHY CHEST  CT ABDOMEN AND PELVIS WITH CONTRAST  TECHNIQUE: Multidetector CT imaging of the chest was performed using the standard protocol during bolus administration of intravenous contrast. Multiplanar CT image  reconstructions and MIPs were obtained to evaluate the vascular anatomy. Multidetector CT imaging of the abdomen and pelvis was performed using the standard protocol during bolus administration of intravenous contrast.  CONTRAST:  OMNIPAQUE IOHEXOL 350 MG/ML SOLN, 80mL OMNIPAQUE IOHEXOL 300 MG/ML SOLN  COMPARISON:  MRI scan of August 07, 2013; CT scan of the chest July 29, 2013.  FINDINGS: CTA CHEST FINDINGS  No pneumothorax is noted. Minimal left pleural effusion is noted with adjacent subsegmental atelectasis. Mild subsegmental atelectasis is noted in the right lung base is well. Thoracic aorta appears normal. No mediastinal mass or adenopathy is noted. There is no definite evidence of pulmonary embolus. No significant osseous abnormality is noted in the chest.  CT ABDOMEN and PELVIS FINDINGS  No gallstones are noted. No focal abnormality is noted in the liver. Adrenal glands  and kidneys appear normal. No hydronephrosis or renal obstruction is noted. The appendix appears normal. There is no evidence of bowel obstruction. Spleen appears normal. The patient has undergone surgical resection of the pancreatic tail. Large fluid collection measuring 9.5 x 8.0 cm is noted in the pancreatic head without defined walls, which may represent postoperative seroma. Surgical clips are noted in this area. Urinary bladder appears normal. No significant adenopathy is noted. Stool is noted throughout the colon.  Review of the MIP images confirms the above findings.  IMPRESSION: No evidence of pulmonary embolus.  Minimal left pleural effusion is noted with adjacent subsegmental atelectasis. Mild subsegmental atelectasis is noted posteriorly in right lung base is well.  Status post surgical resection of pancreatic tail. Surgical staples are noted in this area, as well as 9.5 x 8.0 cm fluid collection without defined walls. This may simply represent postoperative seroma, but developing abscess cannot be excluded if the patient has  clinical signs of infection.   Electronically Signed   By: Roque Lias M.D.   On: 11/18/2013 21:42   Ct Abdomen Pelvis W Contrast  11/18/2013   CLINICAL DATA:  Postoperative chest and abdominal pain.  EXAM: CT ANGIOGRAPHY CHEST  CT ABDOMEN AND PELVIS WITH CONTRAST  TECHNIQUE: Multidetector CT imaging of the chest was performed using the standard protocol during bolus administration of intravenous contrast. Multiplanar CT image reconstructions and MIPs were obtained to evaluate the vascular anatomy. Multidetector CT imaging of the abdomen and pelvis was performed using the standard protocol during bolus administration of intravenous contrast.  CONTRAST:  OMNIPAQUE IOHEXOL 350 MG/ML SOLN, 80mL OMNIPAQUE IOHEXOL 300 MG/ML SOLN  COMPARISON:  MRI scan of August 07, 2013; CT scan of the chest July 29, 2013.  FINDINGS: CTA CHEST FINDINGS  No pneumothorax is noted. Minimal left pleural effusion is noted with adjacent subsegmental atelectasis. Mild subsegmental atelectasis is noted in the right lung base is well. Thoracic aorta appears normal. No mediastinal mass or adenopathy is noted. There is no definite evidence of pulmonary embolus. No significant osseous abnormality is noted in the chest.  CT ABDOMEN and PELVIS FINDINGS  No gallstones are noted. No focal abnormality is noted in the liver. Adrenal glands and kidneys appear normal. No hydronephrosis or renal obstruction is noted. The appendix appears normal. There is no evidence of bowel obstruction. Spleen appears normal. The patient has undergone surgical resection of the pancreatic tail. Large fluid collection measuring 9.5 x 8.0 cm is noted in the pancreatic head without defined walls, which may represent postoperative seroma. Surgical clips are noted in this area. Urinary bladder appears normal. No significant adenopathy is noted. Stool is noted throughout the colon.  Review of the MIP images confirms the above findings.  IMPRESSION: No evidence of pulmonary  embolus.  Minimal left pleural effusion is noted with adjacent subsegmental atelectasis. Mild subsegmental atelectasis is noted posteriorly in right lung base is well.  Status post surgical resection of pancreatic tail. Surgical staples are noted in this area, as well as 9.5 x 8.0 cm fluid collection without defined walls. This may simply represent postoperative seroma, but developing abscess cannot be excluded if the patient has clinical signs of infection.   Electronically Signed   By: Roque Lias M.D.   On: 11/18/2013 21:42     EKG Interpretation   Date/Time:  Thursday November 18 2013 18:33:44 EDT Ventricular Rate:  100 PR Interval:  152 QRS Duration: 89 QT Interval:  364 QTC Calculation: 469 R Axis:  94 Text Interpretation:  Sinus tachycardia Borderline right axis deviation  Baseline wander in lead(s) I II aVR No old tracing to compare Confirmed by  Annita Ratliff  MD-J, Abbee Cremeens ([id number]) on 11/18/2013 6:37:16 PM      MDM   Final diagnoses:  Seroma complicating a procedure, initial encounter   Pt's CT scan is showing a large fluid collection around his sugical site.  I suspect  this the cause of his dyspnea symptoms rather than a pneumonia.  Pt is afebrile , not having any pain.  Cannot exclude infection however.  Pt was seen in the ED by Dr Gerrit Friends who offered admission and to arrange IR drainage.  Pt would prefer to go home.  Will follow up with surgeon in the am.    Linwood Dibbles, MD 11/19/13 0100

## 2013-11-18 NOTE — ED Notes (Signed)
Patient transported to X-ray 

## 2013-11-18 NOTE — Discharge Summary (Signed)
General Surgery [facility]  Called to see patient in the emergency department by Dr. Linwood DibblesJon Knapp.  Patient underwent distal pancreatectomy by Dr. Almond LintFaera Byerly.  Patient presented this evening to the emergency room with shortness of breath and tachycardia. Evaluation included laboratory study showing a mildly elevated white blood cell count of 13,000. Patient has a low-grade temperature of 99.9. Patient underwent a CT scan of the abdomen and pelvis which shows a 8 x 9 cm fluid collection at the site of distal pancreatectomy without enhancing rim or evidence of abscess. Patient also underwent a CT angiogram of the chest to rule out pulmonary embolism. This was a negative study. There was a trace amount of fluid around the base of the left lung.  Patient feels better. He is not dyspneic. O2 saturation is normal on room air.  I discussed admission for intravenous antibiotics and evaluation by interventional radiology for percutaneous drainage of the fluid collection. At this point the patient does not wish to be admitted. He wishes to go home with his wife. He does have an allergy to penicillin. I have opted not to start him on antibiotics at this time.  Dr. Donell BeersByerly will be present tomorrow and I will discuss this case with her first thing in the morning. Patient may still need to come in as an outpatient for percutaneous drainage procedure. I will leave that decision up to Dr. Donell BeersByerly.  Patient knows to contact me by telephone if he has any further issues overnight. Patient and his wife understand and are in agreement with this plan.  Velora Hecklerodd M. Jadore Mcguffin, MD, [facility] Office: [phone]

## 2013-11-18 NOTE — ED Notes (Signed)
CT notified patient has IV and is prepared for imaging

## 2013-11-18 NOTE — ED Notes (Signed)
Patient c/o increased SOB and cough since having a laryngoscopy.

## 2013-11-18 NOTE — ED Notes (Signed)
Per pt, had laparoscopic surgery and was d/c on Monday-trouble catching breath-cough

## 2013-11-18 NOTE — ED Notes (Signed)
Back from CT

## 2013-11-19 ENCOUNTER — Other Ambulatory Visit (INDEPENDENT_AMBULATORY_CARE_PROVIDER_SITE_OTHER): Payer: Self-pay

## 2013-11-19 DIAGNOSIS — K8689 Other specified diseases of pancreas: Secondary | ICD-10-CM

## 2013-11-20 ENCOUNTER — Other Ambulatory Visit: Payer: Self-pay | Admitting: Radiology

## 2013-11-22 ENCOUNTER — Encounter (HOSPITAL_COMMUNITY): Payer: Self-pay

## 2013-11-22 ENCOUNTER — Ambulatory Visit (HOSPITAL_COMMUNITY)
Admission: RE | Admit: 2013-11-22 | Discharge: 2013-11-22 | Disposition: A | Discharge status: Home / Self Care | Payer: No Typology Code available for payment source | Source: Ambulatory Visit | Attending: General Surgery | Admitting: General Surgery

## 2013-11-22 DIAGNOSIS — K8689 Other specified diseases of pancreas: Secondary | ICD-10-CM

## 2013-11-22 DIAGNOSIS — R188 Other ascites: Secondary | ICD-10-CM | POA: Insufficient documentation

## 2013-11-22 LAB — BASIC METABOLIC PANEL
ANION GAP: 17 — AB (ref 5–15)
BUN: 6 mg/dL (ref 6–23)
CO2: 22 meq/L (ref 19–32)
CREATININE: 0.94 mg/dL (ref 0.50–1.35)
Calcium: 9.2 mg/dL (ref 8.4–10.5)
Chloride: 100 mEq/L (ref 96–112)
GFR calc Af Amer: >90 mL/min (ref >90)
GFR calc non Af Amer: >90 mL/min (ref >90)
Glucose, Bld: 111 mg/dL — ABNORMAL HIGH (ref 70–99)
POTASSIUM: 4 meq/L (ref 3.7–5.3)
Sodium: 139 mEq/L (ref 137–147)

## 2013-11-22 LAB — CBC WITH DIFFERENTIAL/PLATELET
BASOS PCT: 0 % (ref 0–1)
Basophils Absolute: 0 10*3/uL (ref 0.0–0.1)
Eosinophils Absolute: 0.2 10*3/uL (ref 0.0–0.7)
Eosinophils Relative: 1 % (ref 0–5)
HEMATOCRIT: 39.6 % (ref 39.0–52.0)
Hemoglobin: 13.4 g/dL (ref 13.0–17.0)
LYMPHS PCT: 15 % (ref 12–46)
Lymphs Abs: 1.8 10*3/uL (ref 0.7–4.0)
MCH: 28.9 pg (ref 26.0–34.0)
MCHC: 33.8 g/dL (ref 30.0–36.0)
MCV: 85.5 fL (ref 78.0–100.0)
MONOS PCT: 9 % (ref 3–12)
Monocytes Absolute: 1.1 10*3/uL — ABNORMAL HIGH (ref 0.1–1.0)
NEUTROS PCT: 75 % (ref 43–77)
Neutro Abs: 9.1 10*3/uL — ABNORMAL HIGH (ref 1.7–7.7)
Platelets: 469 10*3/uL — ABNORMAL HIGH (ref 150–400)
RBC: 4.63 MIL/uL (ref 4.22–5.81)
RDW: 12.3 % (ref 11.5–15.5)
WBC: 12.2 10*3/uL — ABNORMAL HIGH (ref 4.0–10.5)

## 2013-11-22 LAB — PROTIME-INR
INR: 1.18 (ref 0.00–1.49)
Prothrombin Time: 15 seconds (ref 11.6–15.2)

## 2013-11-22 MED ORDER — MIDAZOLAM HCL 2 MG/2ML IJ SOLN
INTRAMUSCULAR | Status: AC | PRN
  Administered 2013-11-22: 2 mg via INTRAVENOUS
  Administered 2013-11-22 (×2): 1 mg via INTRAVENOUS

## 2013-11-22 MED ORDER — SODIUM CHLORIDE 0.9 % IV SOLN: INTRAVENOUS | Status: DC

## 2013-11-22 MED ORDER — FENTANYL CITRATE 0.05 MG/ML IJ SOLN
INTRAMUSCULAR | Status: AC | PRN
  Administered 2013-11-22 (×4): 50 ug via INTRAVENOUS

## 2013-11-22 MED ORDER — FENTANYL CITRATE 0.05 MG/ML IJ SOLN
INTRAMUSCULAR | Status: AC
  Filled 2013-11-22: qty 4

## 2013-11-22 MED ORDER — MIDAZOLAM HCL 2 MG/2ML IJ SOLN
INTRAMUSCULAR | Status: AC
  Filled 2013-11-22: qty 4

## 2013-11-22 NOTE — Sedation Documentation (Signed)
385cc dark brownish fluid aspirated

## 2013-11-22 NOTE — H&P (Signed)
Chief Complaint: RUQ pain Difficulty taking deep breath Post surgical seroma  Referring Physician(s): Byerly,Faera  History of Present Illness: Tyler Smith is a 53 y.o. male  Pt developed cough in June 2015 CXR revealed incidental pancreatic cyst Bx with Dr Dulce Sellar showed abnormal/atypical cells Referred to Dr Donell Beers Distal pancreatic surgery/removal was performed 11/10/13 Noted difficulty with deep breath and RUQ pain last few weeks CT 11/18/13 reveals post surgical seroma Now scheduled for aspiration of same  Past Medical History  Diagnosis Date  . Thyroid disease   . Hypercholesterolemia   . GERD (gastroesophageal reflux disease)     none recent  . Hypothyroidism   . Pancreatic cyst     Past Surgical History  Procedure Laterality Date  . Eye surgery Bilateral age 71    lazy eye  . Pinkie finger surgfery Right yrs ago  . Eus N/A 09/01/2013    Procedure: ESOPHAGEAL ENDOSCOPIC ULTRASOUND (EUS) RADIAL;  Surgeon: Willis Modena, MD;  Location: [facility];  Service: Endoscopy;  Laterality: N/A;  . Fine needle aspiration N/A 09/01/2013    Procedure: FINE NEEDLE ASPIRATION (FNA) LINEAR;  Surgeon: Willis Modena, MD;  Location: [facility];  Service: Endoscopy;  Laterality: N/A;    Allergies: Penicillins  Medications: Prior to Admission medications   Medication Sig Start Date End Date Taking? Authorizing Provider  acetaminophen (TYLENOL) 325 MG tablet Take 325 mg by mouth daily as needed for moderate pain.    Yes Historical Provider, MD  aspirin EC 81 MG tablet Take 81 mg by mouth at bedtime.    Yes Historical Provider, MD  HYDROcodone-acetaminophen (NORCO/VICODIN) 5-325 MG per tablet Take 1-2 tablets by mouth every 4 (four) hours as needed for moderate pain or severe pain. 11/15/13  Yes Karie Soda, MD  ibuprofen (ADVIL,MOTRIN) 600 MG tablet Take 1 tablet (600 mg total) by mouth every 6 (six) hours as needed for fever, headache, mild pain or moderate pain. 11/15/13  Yes  Karie Soda, MD  levofloxacin (LEVAQUIN) 750 MG tablet Take 750 mg by mouth daily.   Yes Historical Provider, MD  levothyroxine (SYNTHROID, LEVOTHROID) 112 MCG tablet Take 112 mcg by mouth daily before breakfast.   Yes Historical Provider, MD  metroNIDAZOLE (FLAGYL) 500 MG tablet Take 500 mg by mouth 3 (three) times daily.   Yes Historical Provider, MD  pravastatin (PRAVACHOL) 40 MG tablet Take 40 mg by mouth at bedtime.    Yes Historical Provider, MD    Family History  Problem Relation Age of Onset  . Cancer - Other Mother   . COPD Father   . Coronary artery disease Brother   . Coronary artery disease Brother     History   Social History  . Marital Status: Married    Spouse Name: N/A    Number of Children: N/A  . Years of Education: N/A   Social History Main Topics  . Smoking status: Never Smoker   . Smokeless tobacco: Never Used  . Alcohol Use: Yes     Comment: occasional  . Drug Use: No  . Sexual Activity: Yes   Other Topics Concern  . None   Social History Narrative  . None    Review of Systems: A 12 point ROS discussed and pertinent positives are indicated in the HPI above.  All other systems are negative.  Review of Systems  Constitutional: Negative for fever, activity change and appetite change.  Respiratory: Positive for cough. Negative for chest tightness, shortness of breath and wheezing.  Cardiovascular: Negative for chest pain.  Gastrointestinal: Positive for abdominal pain. Negative for nausea.  Musculoskeletal: Negative for back pain.  Psychiatric/Behavioral: Negative for confusion.    Vital Signs: BP 116/88  Pulse 108  Temp(Src) 98.3 F (36.8 C) (Oral)  Resp 18  Ht 5\' 11"  (1.803 m)  Wt 97.523 kg (215 lb)  BMI 30.00 kg/m2  SpO2 96%  Physical Exam  Constitutional: He is oriented to person, place, and time. He appears well-nourished.  Cardiovascular: Normal rate and regular rhythm.   No murmur heard. Pulmonary/Chest: Effort normal and  breath sounds normal. He has no wheezes.  Abdominal: Soft. There is no tenderness.  Musculoskeletal: Normal range of motion.  Neurological: He is alert and oriented to person, place, and time.  Skin: Skin is warm and dry.  Psychiatric: He has a normal mood and affect. His behavior is normal. Judgment and thought content normal.    Imaging: Dg Chest 2 View  11/18/2013   CLINICAL DATA:  Shortness of breath for 2 days. Dry cough since March.  EXAM: CHEST  2 VIEW  COMPARISON:  07/23/2013 and CT of 07/29/2013  FINDINGS: Midline trachea. Heart size upper normal. Mediastinal contours otherwise within normal limits. Small left pleural effusion. No pneumothorax. Bibasilar volume loss with patchy airspace disease, greater on the left.  IMPRESSION: Small left pleural effusion. Bibasilar opacities. Likely atelectasis. At the left lung base, infection cannot be excluded.   Electronically Signed   By: Jeronimo GreavesKyle  Talbot M.D.   On: 11/18/2013 18:38   Ct Angio Chest W/cm &/or Wo Cm  11/18/2013   CLINICAL DATA:  Postoperative chest and abdominal pain.  EXAM: CT ANGIOGRAPHY CHEST  CT ABDOMEN AND PELVIS WITH CONTRAST  TECHNIQUE: Multidetector CT imaging of the chest was performed using the standard protocol during bolus administration of intravenous contrast. Multiplanar CT image reconstructions and MIPs were obtained to evaluate the vascular anatomy. Multidetector CT imaging of the abdomen and pelvis was performed using the standard protocol during bolus administration of intravenous contrast.  CONTRAST:  100mL OMNIPAQUE IOHEXOL 350 MG/ML SOLN, 80mL OMNIPAQUE IOHEXOL 300 MG/ML SOLN  COMPARISON:  MRI scan of August 07, 2013; CT scan of the chest July 29, 2013.  FINDINGS: CTA CHEST FINDINGS  No pneumothorax is noted. Minimal left pleural effusion is noted with adjacent subsegmental atelectasis. Mild subsegmental atelectasis is noted in the right lung base is well. Thoracic aorta appears normal. No mediastinal mass or adenopathy is  noted. There is no definite evidence of pulmonary embolus. No significant osseous abnormality is noted in the chest.  CT ABDOMEN and PELVIS FINDINGS  No gallstones are noted. No focal abnormality is noted in the liver. Adrenal glands and kidneys appear normal. No hydronephrosis or renal obstruction is noted. The appendix appears normal. There is no evidence of bowel obstruction. Spleen appears normal. The patient has undergone surgical resection of the pancreatic tail. Large fluid collection measuring 9.5 x 8.0 cm is noted in the pancreatic head without defined walls, which may represent postoperative seroma. Surgical clips are noted in this area. Urinary bladder appears normal. No significant adenopathy is noted. Stool is noted throughout the colon.  Review of the MIP images confirms the above findings.  IMPRESSION: No evidence of pulmonary embolus.  Minimal left pleural effusion is noted with adjacent subsegmental atelectasis. Mild subsegmental atelectasis is noted posteriorly in right lung base is well.  Status post surgical resection of pancreatic tail. Surgical staples are noted in this area, as well as 9.5 x 8.0 cm  fluid collection without defined walls. This may simply represent postoperative seroma, but developing abscess cannot be excluded if the patient has clinical signs of infection.   Electronically Signed   By: Roque LiasJames  Green M.D.   On: 11/18/2013 21:42   Ct Abdomen Pelvis W Contrast  11/18/2013   CLINICAL DATA:  Postoperative chest and abdominal pain.  EXAM: CT ANGIOGRAPHY CHEST  CT ABDOMEN AND PELVIS WITH CONTRAST  TECHNIQUE: Multidetector CT imaging of the chest was performed using the standard protocol during bolus administration of intravenous contrast. Multiplanar CT image reconstructions and MIPs were obtained to evaluate the vascular anatomy. Multidetector CT imaging of the abdomen and pelvis was performed using the standard protocol during bolus administration of intravenous contrast.   CONTRAST:  100mL OMNIPAQUE IOHEXOL 350 MG/ML SOLN, 80mL OMNIPAQUE IOHEXOL 300 MG/ML SOLN  COMPARISON:  MRI scan of August 07, 2013; CT scan of the chest July 29, 2013.  FINDINGS: CTA CHEST FINDINGS  No pneumothorax is noted. Minimal left pleural effusion is noted with adjacent subsegmental atelectasis. Mild subsegmental atelectasis is noted in the right lung base is well. Thoracic aorta appears normal. No mediastinal mass or adenopathy is noted. There is no definite evidence of pulmonary embolus. No significant osseous abnormality is noted in the chest.  CT ABDOMEN and PELVIS FINDINGS  No gallstones are noted. No focal abnormality is noted in the liver. Adrenal glands and kidneys appear normal. No hydronephrosis or renal obstruction is noted. The appendix appears normal. There is no evidence of bowel obstruction. Spleen appears normal. The patient has undergone surgical resection of the pancreatic tail. Large fluid collection measuring 9.5 x 8.0 cm is noted in the pancreatic head without defined walls, which may represent postoperative seroma. Surgical clips are noted in this area. Urinary bladder appears normal. No significant adenopathy is noted. Stool is noted throughout the colon.  Review of the MIP images confirms the above findings.  IMPRESSION: No evidence of pulmonary embolus.  Minimal left pleural effusion is noted with adjacent subsegmental atelectasis. Mild subsegmental atelectasis is noted posteriorly in right lung base is well.  Status post surgical resection of pancreatic tail. Surgical staples are noted in this area, as well as 9.5 x 8.0 cm fluid collection without defined walls. This may simply represent postoperative seroma, but developing abscess cannot be excluded if the patient has clinical signs of infection.   Electronically Signed   By: Roque LiasJames  Green M.D.   On: 11/18/2013 21:42    Labs:  CBC:  Recent Labs  11/13/13 0455 11/14/13 0520 11/18/13 1910 11/22/13 1256  WBC 11.0* 10.3 13.6*  12.2*  HGB 12.4* 12.8* 12.8* 13.4  HCT 36.8* 37.9* 37.5* 39.6  PLT 244 287 429* 469*    COAGS:  Recent Labs  11/05/13 0940 11/22/13 1256  INR 1.02 1.18    BMP:  Recent Labs  11/11/13 0430 11/12/13 0449 11/14/13 0520 11/18/13 1910  NA 135* 132* 136* 135*  K 3.9 3.9 3.7 3.8  CL 98 95* 98 95*  CO2 26 26 26 25   GLUCOSE 166* 152* 126* 116*  BUN 6 6 7 7   CALCIUM 8.5 8.4 8.4 8.8  CREATININE 0.84 0.75 0.76 0.86  GFRNONAA >90 >90 >90 >90  GFRAA >90 >90 >90 >90    LIVER FUNCTION TESTS:  Recent Labs  11/05/13 0940 11/12/13 0449 11/18/13 1910  BILITOT 0.4 1.0 0.6  AST 27 19 41*  ALT 32 24 76*  ALKPHOS 75 58 92  PROT 8.1 6.5 7.1  ALBUMIN 4.2  2.8* 3.1*    TUMOR MARKERS: No results found for this basename: AFPTM, CEA, CA199, CHROMGRNA,  in the last 8760 hours  Assessment and Plan:  Pancreatic cyst found incidentally on CXR/CT Atypical cells on Bx Removal 9/30 Now with several days of RUQ pain and difficulty to take deep breath CT reveals new collection--prob seroma Now scheduled for aspiration Pt and wife aware of procedure benefits and risks and agreeable to proceed Consent signed andin chart  Thank you for this interesting consult.  I greatly enjoyed meeting Tyler Smith Tyler Smith and look forward to participating in their care.    I spent a total of 20 minutes face to face in clinical consultation, greater than 50% of which was counseling/coordinating care for abd seroma aspirtaion  Signed: Aleka Twitty A 11/22/2013, 1:36 PM

## 2013-11-22 NOTE — Procedures (Signed)
Interventional Radiology Procedure Note  Procedure: CT guided aspiration of fluid collection in the pancreatic resection bed reveals 385 mL thin, brownish colored fluid.  No odor.  Fluid collection completely aspirated.  Complications: none Recommendations: - D/C home - Lipase and Cx pending  Signed,  Sterling BigHeath K. McCullough, MD

## 2013-11-26 ENCOUNTER — Telehealth (INDEPENDENT_AMBULATORY_CARE_PROVIDER_SITE_OTHER): Payer: Self-pay

## 2013-11-26 LAB — BODY FLUID CULTURE
CULTURE: NO GROWTH
Gram Stain: NONE SEEN

## 2013-11-26 NOTE — Telephone Encounter (Signed)
Pt called and was given culture results.  No growth in 3 days.  Pt will f/u with Dr. Donell BeersByerly.

## 2014-01-07 ENCOUNTER — Encounter (HOSPITAL_COMMUNITY): Payer: Self-pay

## 2014-01-07 ENCOUNTER — Emergency Department (HOSPITAL_COMMUNITY): Payer: No Typology Code available for payment source

## 2014-01-07 ENCOUNTER — Inpatient Hospital Stay (HOSPITAL_COMMUNITY)
Admission: EM | Admit: 2014-01-07 | Discharge: 2014-01-14 | DRG: 358 | Disposition: A | Discharge status: Home Health | Payer: No Typology Code available for payment source | Attending: General Surgery | Admitting: General Surgery

## 2014-01-07 DIAGNOSIS — Z90411 Acquired partial absence of pancreas: Secondary | ICD-10-CM | POA: Diagnosis present

## 2014-01-07 DIAGNOSIS — R188 Other ascites: Secondary | ICD-10-CM | POA: Insufficient documentation

## 2014-01-07 DIAGNOSIS — R509 Fever, unspecified: Secondary | ICD-10-CM | POA: Diagnosis not present

## 2014-01-07 DIAGNOSIS — Z880 Allergy status to penicillin: Secondary | ICD-10-CM

## 2014-01-07 DIAGNOSIS — K6819 Other retroperitoneal abscess: Principal | ICD-10-CM | POA: Diagnosis present

## 2014-01-07 DIAGNOSIS — K219 Gastro-esophageal reflux disease without esophagitis: Secondary | ICD-10-CM | POA: Diagnosis present

## 2014-01-07 DIAGNOSIS — Z7982 Long term (current) use of aspirin: Secondary | ICD-10-CM

## 2014-01-07 DIAGNOSIS — Z79899 Other long term (current) drug therapy: Secondary | ICD-10-CM

## 2014-01-07 DIAGNOSIS — E780 Pure hypercholesterolemia: Secondary | ICD-10-CM | POA: Diagnosis present

## 2014-01-07 DIAGNOSIS — E039 Hypothyroidism, unspecified: Secondary | ICD-10-CM | POA: Diagnosis present

## 2014-01-07 DIAGNOSIS — K651 Peritoneal abscess: Secondary | ICD-10-CM | POA: Diagnosis present

## 2014-01-07 LAB — URINALYSIS, ROUTINE W REFLEX MICROSCOPIC
Bilirubin Urine: NEGATIVE
Glucose, UA: NEGATIVE mg/dL
Ketones, ur: NEGATIVE mg/dL
Leukocytes, UA: NEGATIVE
Nitrite: NEGATIVE
Protein, ur: NEGATIVE mg/dL
Specific Gravity, Urine: 1.004 — ABNORMAL LOW (ref 1.005–1.030)
Urobilinogen, UA: 0.2 mg/dL (ref 0.0–1.0)
pH: 6.5 (ref 5.0–8.0)

## 2014-01-07 LAB — I-STAT CG4 LACTIC ACID, ED: Lactic Acid, Venous: 1.15 mmol/L (ref 0.5–2.2)

## 2014-01-07 LAB — CBC WITH DIFFERENTIAL/PLATELET
Basophils Absolute: 0 10*3/uL (ref 0.0–0.1)
Basophils Relative: 0 % (ref 0–1)
Eosinophils Absolute: 0 10*3/uL (ref 0.0–0.7)
Eosinophils Relative: 0 % (ref 0–5)
HCT: 43.5 % (ref 39.0–52.0)
Hemoglobin: 14.4 g/dL (ref 13.0–17.0)
Lymphocytes Relative: 8 % — ABNORMAL LOW (ref 12–46)
Lymphs Abs: 1.9 10*3/uL (ref 0.7–4.0)
MCH: 26.9 pg (ref 26.0–34.0)
MCHC: 33.1 g/dL (ref 30.0–36.0)
MCV: 81.3 fL (ref 78.0–100.0)
Monocytes Absolute: 2.9 10*3/uL — ABNORMAL HIGH (ref 0.1–1.0)
Monocytes Relative: 12 % (ref 3–12)
Neutro Abs: 19 10*3/uL — ABNORMAL HIGH (ref 1.7–7.7)
Neutrophils Relative %: 80 % — ABNORMAL HIGH (ref 43–77)
Platelets: 320 10*3/uL (ref 150–400)
RBC: 5.35 MIL/uL (ref 4.22–5.81)
RDW: 14 % (ref 11.5–15.5)
WBC: 23.8 10*3/uL — ABNORMAL HIGH (ref 4.0–10.5)

## 2014-01-07 LAB — COMPREHENSIVE METABOLIC PANEL
ALT: 42 U/L (ref 0–53)
AST: 26 U/L (ref 0–37)
Albumin: 3.2 g/dL — ABNORMAL LOW (ref 3.5–5.2)
Alkaline Phosphatase: 161 U/L — ABNORMAL HIGH (ref 39–117)
Anion gap: 15 (ref 5–15)
BUN: 8 mg/dL (ref 6–23)
CO2: 27 mEq/L (ref 19–32)
Calcium: 9.4 mg/dL (ref 8.4–10.5)
Chloride: 96 mEq/L (ref 96–112)
Creatinine, Ser: 0.94 mg/dL (ref 0.50–1.35)
GFR calc Af Amer: >90 mL/min (ref >90)
GFR calc non Af Amer: >90 mL/min (ref >90)
Glucose, Bld: 123 mg/dL — ABNORMAL HIGH (ref 70–99)
Potassium: 4.7 mEq/L (ref 3.7–5.3)
Sodium: 138 mEq/L (ref 137–147)
Total Bilirubin: 1.3 mg/dL — ABNORMAL HIGH (ref 0.3–1.2)
Total Protein: 7.7 g/dL (ref 6.0–8.3)

## 2014-01-07 LAB — URINE MICROSCOPIC-ADD ON: Urine-Other: NONE SEEN

## 2014-01-07 LAB — LIPASE, BLOOD: Lipase: 24 U/L (ref 11–59)

## 2014-01-07 MED ORDER — IOHEXOL 300 MG/ML  SOLN
100.0000 mL | Freq: Once | INTRAMUSCULAR | Status: AC | PRN
  Administered 2014-01-07: 100 mL via INTRAVENOUS

## 2014-01-07 MED ORDER — SODIUM CHLORIDE 0.9 % IV BOLUS (SEPSIS)
1000.0000 mL | Freq: Once | INTRAVENOUS | Status: AC
  Administered 2014-01-07: 1000 mL via INTRAVENOUS

## 2014-01-07 MED ORDER — METRONIDAZOLE IN NACL 5-0.79 MG/ML-% IV SOLN
500.0000 mg | Freq: Once | INTRAVENOUS | Status: AC
  Administered 2014-01-08: 500 mg via INTRAVENOUS
  Filled 2014-01-07: qty 100

## 2014-01-07 MED ORDER — LEVOFLOXACIN IN D5W 750 MG/150ML IV SOLN
750.0000 mg | Freq: Once | INTRAVENOUS | Status: AC
  Administered 2014-01-07: 750 mg via INTRAVENOUS
  Filled 2014-01-07: qty 150

## 2014-01-07 MED ORDER — ACETAMINOPHEN 325 MG PO TABS
650.0000 mg | ORAL_TABLET | Freq: Once | ORAL | Status: AC
  Administered 2014-01-07: 650 mg via ORAL
  Filled 2014-01-07: qty 2

## 2014-01-07 NOTE — ED Notes (Signed)
End of September, pt had surgery on pancreas.  Partial removal.  Pt had wbc of 24 and placed on antibiotics for fever/ chills.  Pt ended meds on 11/21.  Pt continues with fever, chills.  Decreased appetite.  Outside wound has healed.

## 2014-01-07 NOTE — ED Notes (Signed)
Patient transported to X-ray 

## 2014-01-07 NOTE — ED Provider Notes (Signed)
CSN: [id number]     Arrival date & time 01/07/14  1336 History   First MD Initiated Contact with Patient 01/07/14 1550     Chief Complaint  Patient presents with  . Fever     (Consider location/radiation/quality/duration/timing/severity/associated sxs/prior Treatment) HPI   53yM with fever and pleuritic L flank pain. Pt is s/p distal pancreatectomy approximately two months ago. On 10/8 he presented to the ED with dyspnea and, as part of this work up, he underwent a CT scan of the abdomen and pelvis which showed a 8 x 9 cm fluid collection at the site of distal pancreatectomy without enhancing rim or evidence of abscess. On 10/12 he had CT guided aspiration of fluid collection in the pancreatic resection bed.  A little over week ago he began having fever, night sweats and pain in L flank/LUQ. Pain is sharp and exapcerbated by deep breaths and coughing. Seen by PCP who did blood work which pt reports significant for leukocytosis of 24,000 and started on azithromycin for pneumonia? Also given course of steroids and medications for GERD. Pt/wife report that didn't have CXR. Symptoms persisted despite this which is why presented today.  Has occasional nonproductive cough. No SOB. No urinary complaints.     Past Medical History  Diagnosis Date  . Thyroid disease   . Hypercholesterolemia   . GERD (gastroesophageal reflux disease)     none recent  . Hypothyroidism   . Pancreatic cyst    Past Surgical History  Procedure Laterality Date  . Eye surgery Bilateral age 36    lazy eye  . Pinkie finger surgfery Right yrs ago  . Eus N/A 09/01/2013    Procedure: ESOPHAGEAL ENDOSCOPIC ULTRASOUND (EUS) RADIAL;  Surgeon: Willis ModenaWilliam Outlaw, MD;  Location: [facility];  Service: Endoscopy;  Laterality: N/A;  . Fine needle aspiration N/A 09/01/2013    Procedure: FINE NEEDLE ASPIRATION (FNA) LINEAR;  Surgeon: Willis ModenaWilliam Outlaw, MD;  Location: [facility];  Service: Endoscopy;  Laterality: N/A;   Family History   Problem Relation Age of Onset  . Cancer - Other Mother   . COPD Father   . Coronary artery disease Brother   . Coronary artery disease Brother    History  Substance Use Topics  . Smoking status: Never Smoker   . Smokeless tobacco: Never Used  . Alcohol Use: Yes     Comment: occasional    Review of Systems  All systems reviewed and negative, other than as noted in HPI.   Allergies  Penicillins  Home Medications   Prior to Admission medications   Medication Sig Start Date End Date Taking? Authorizing Provider  acetaminophen (TYLENOL) 325 MG tablet Take 650 mg by mouth daily as needed for moderate pain.    Yes Historical Provider, MD  aspirin EC 81 MG tablet Take 81 mg by mouth at bedtime.    Yes Historical Provider, MD  HYDROcodone-acetaminophen (NORCO/VICODIN) 5-325 MG per tablet Take 1-2 tablets by mouth every 4 (four) hours as needed for moderate pain or severe pain. 11/15/13  Yes Karie SodaSteven Gross, MD  ibuprofen (ADVIL,MOTRIN) 600 MG tablet Take 1 tablet (600 mg total) by mouth every 6 (six) hours as needed for fever, headache, mild pain or moderate pain. 11/15/13  Yes Karie SodaSteven Gross, MD  levothyroxine (SYNTHROID, LEVOTHROID) 112 MCG tablet Take 112 mcg by mouth daily before breakfast.   Yes Historical Provider, MD  pantoprazole (PROTONIX) 40 MG tablet Take 40 mg by mouth daily.  12/27/13  Yes Historical Provider, MD  pravastatin (  PRAVACHOL) 40 MG tablet Take 40 mg by mouth at bedtime.    Yes Historical Provider, MD  azithromycin (ZITHROMAX) 250 MG tablet  12/28/13   Historical Provider, MD  levofloxacin (LEVAQUIN) 750 MG tablet Take 750 mg by mouth daily.    Historical Provider, MD  metroNIDAZOLE (FLAGYL) 500 MG tablet Take 500 mg by mouth 3 (three) times daily.    Historical Provider, MD  predniSONE (DELTASONE) 20 MG tablet  12/27/13   Historical Provider, MD   BP 103/63 mmHg  Pulse 89  Temp(Src) 97.9 F (36.6 C) (Oral)  Resp 18  SpO2 95% Physical Exam  Constitutional: He  appears well-developed and well-nourished. No distress.  HENT:  Head: Normocephalic and atraumatic.  Eyes: Conjunctivae are normal. Right eye exhibits no discharge. Left eye exhibits no discharge.  Neck: Neck supple.  Cardiovascular: Normal rate, regular rhythm and normal heart sounds.  Exam reveals no gallop and no friction rub.   No murmur heard. Pulmonary/Chest: Effort normal. No respiratory distress.  Rhonchi L base  Abdominal: Soft. He exhibits no distension. There is tenderness.  Well healed surgical incisions. LUQ tenderness w/o rebound or guarding  Musculoskeletal: He exhibits no edema or tenderness.  Neurological: He is alert.  Skin: Skin is warm and dry.  Psychiatric: He has a normal mood and affect. His behavior is normal. Thought content normal.  Nursing note and vitals reviewed.   ED Course  Procedures (including critical care time) Labs Review Labs Reviewed  CBC WITH DIFFERENTIAL - Abnormal; Notable for the following:    WBC 23.8 (*)    Neutrophils Relative % 80 (*)    Lymphocytes Relative 8 (*)    Neutro Abs 19.0 (*)    Monocytes Absolute 2.9 (*)    All other components within normal limits  COMPREHENSIVE METABOLIC PANEL - Abnormal; Notable for the following:    Glucose, Bld 123 (*)    Albumin 3.2 (*)    Alkaline Phosphatase 161 (*)    Total Bilirubin 1.3 (*)    All other components within normal limits  URINALYSIS, ROUTINE W REFLEX MICROSCOPIC - Abnormal; Notable for the following:    APPearance CLOUDY (*)    Specific Gravity, Urine 1.004 (*)    Hgb urine dipstick SMALL (*)    All other components within normal limits  CULTURE, BLOOD (ROUTINE X 2)  CULTURE, BLOOD (ROUTINE X 2)  LIPASE, BLOOD  URINE MICROSCOPIC-ADD ON  I-STAT CG4 LACTIC ACID, ED    Imaging Review Dg Chest 2 View  01/07/2014   CLINICAL DATA:  Cough, fever  EXAM: CHEST  2 VIEW  COMPARISON:  CT chest dated 11/18/2013  FINDINGS: Low lung volumes. Patchy bibasilar opacities, left greater  than right, likely atelectasis. Left lower lobe pneumonia is not entirely excluded. No pleural effusion or pneumothorax.  The heart is normal in size.  Visualized osseous structures are within normal limits.  IMPRESSION: Low lung volumes with patchy bibasilar opacities, likely atelectasis.  Left lower lobe pneumonia not entirely excluded.   Electronically Signed   By: Charline BillsSriyesh  Krishnan M.D.   On: 01/07/2014 18:26   Ct Abdomen Pelvis W Contrast  01/07/2014   CLINICAL DATA:  53 year old male with persistent fever and chills. History of partial pancreatic resection. Recently finished antibiotic therapy on 01/01/2014.  EXAM: CT ABDOMEN AND PELVIS WITH CONTRAST  TECHNIQUE: Multidetector CT imaging of the abdomen and pelvis was performed using the standard protocol following bolus administration of intravenous contrast.  CONTRAST:  100mL OMNIPAQUE IOHEXOL 300 MG/ML  SOLN  COMPARISON:  Most recent prior CT scan of the abdomen and pelvis 11/22/2013 ; MRI of the abdomen 08/07/2013  FINDINGS: Lower Chest: Mild dependent atelectasis in the left lower lobe. Trace left pleural effusion. Visualized cardiac structures are within normal limits for size. No pericardial effusion. Unremarkable distal thoracic esophagus.  Abdomen: Unremarkable CT appearance of the stomach, duodenum, spleen, and adrenal glands. Normal hepatic contour and morphology. Hypoattenuating lesion in the posterior aspect of hepatic segment 6 consistent with hemangioma as identified on the prior MRI. Gallbladder is unremarkable. No intra or extrahepatic biliary ductal dilatation. Unremarkable appearance of the bilateral kidneys. No focal solid lesion, hydronephrosis or nephrolithiasis. No focal bowel wall thickening or evidence of obstruction. Normal appendix in the right lower quadrant.  A mortise peripherally enhancing fluid and gas collection in the left subdiaphragmatic space extending into the pancreatic tail resection bed. The fluid collection is  difficult to measure given its a amorphous shape. The pancreatic bed fluid collection measures approximately 5.7 x 3.1 cm. The larger collection in the left subdiaphragmatic space measures approximately 9.3 x 4.4 cm. Of note, the splenic flexure of the colon passes directly between the 2 fluid collections and the lumen of the splenic flexure appears to be contiguous with the fluid and gas collection in the pancreatic resection bed (axial image 27 series 2 ; sagittal image 101 series 4). There is an adjacent surgical clip. Small volume of perisplenic fluid and gas. Inflammatory stranding is present about the fluid collections in the left upper quadrant.  Pelvis: Unremarkable bladder, prostate gland and seminal vesicles. No free fluid or suspicious adenopathy.  Bones/Soft Tissues: No acute fracture or aggressive appearing lytic or blastic osseous lesion.  Vascular: The splenic vein is not well seen and is likely thrombosed. There are developing perigastric collaterals. No evidence of arterial pseudoaneurysm or occlusion.  IMPRESSION: 1. Peripherally enhancing fluid and gas collection in the pancreatic tail resection bed and left subdiaphragmatic space which appears contiguous with the lumen of the splenic flexure of the colon. Imaging findings are concerning for abscess eroding into the adjacent colon versus contained colonic leak/perforation. 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 3. Trace left pleural effusion and associated lower lobe atelectasis. 4. Additional ancillary findings as above without significant interval change.   Electronically Signed   By: Malachy Moan M.D.   On: 01/07/2014 23:19     EKG Interpretation None      MDM   Final diagnoses:  Fever  Intraabdominal fluid collection    53yM with intraabdominal collection concerning for possible abscess or contained colonic perforation. Initially ordered levaquin for possible pneumonia. Flagyl added for  additional coverage. Discussed with Dr Maisie Fus, surgery. Will eval pt in ED.     Raeford Razor, MD 01/08/14 [phone]

## 2014-01-08 ENCOUNTER — Inpatient Hospital Stay (HOSPITAL_COMMUNITY): Payer: No Typology Code available for payment source

## 2014-01-08 ENCOUNTER — Encounter (HOSPITAL_COMMUNITY): Payer: Self-pay | Admitting: Radiology

## 2014-01-08 DIAGNOSIS — K651 Peritoneal abscess: Secondary | ICD-10-CM | POA: Diagnosis present

## 2014-01-08 DIAGNOSIS — Z825 Family history of asthma and other chronic lower respiratory diseases: Secondary | ICD-10-CM | POA: Diagnosis not present

## 2014-01-08 DIAGNOSIS — Z880 Allergy status to penicillin: Secondary | ICD-10-CM | POA: Diagnosis not present

## 2014-01-08 DIAGNOSIS — R509 Fever, unspecified: Secondary | ICD-10-CM | POA: Diagnosis present

## 2014-01-08 DIAGNOSIS — Z8249 Family history of ischemic heart disease and other diseases of the circulatory system: Secondary | ICD-10-CM | POA: Diagnosis not present

## 2014-01-08 DIAGNOSIS — R188 Other ascites: Secondary | ICD-10-CM | POA: Insufficient documentation

## 2014-01-08 DIAGNOSIS — K219 Gastro-esophageal reflux disease without esophagitis: Secondary | ICD-10-CM | POA: Diagnosis present

## 2014-01-08 DIAGNOSIS — K6819 Other retroperitoneal abscess: Secondary | ICD-10-CM | POA: Diagnosis present

## 2014-01-08 DIAGNOSIS — Z90411 Acquired partial absence of pancreas: Secondary | ICD-10-CM | POA: Diagnosis present

## 2014-01-08 DIAGNOSIS — E039 Hypothyroidism, unspecified: Secondary | ICD-10-CM | POA: Diagnosis present

## 2014-01-08 DIAGNOSIS — E780 Pure hypercholesterolemia: Secondary | ICD-10-CM | POA: Diagnosis present

## 2014-01-08 LAB — CBC
HEMATOCRIT: 37.9 % — AB (ref 39.0–52.0)
Hemoglobin: 12.2 g/dL — ABNORMAL LOW (ref 13.0–17.0)
MCH: 26.2 pg (ref 26.0–34.0)
MCHC: 32.2 g/dL (ref 30.0–36.0)
MCV: 81.3 fL (ref 78.0–100.0)
Platelets: 260 10*3/uL (ref 150–400)
RBC: 4.66 MIL/uL (ref 4.22–5.81)
RDW: 14.1 % (ref 11.5–15.5)
WBC: 17.4 10*3/uL — AB (ref 4.0–10.5)

## 2014-01-08 LAB — PROTIME-INR
INR: 1.32 (ref 0.00–1.49)
Prothrombin Time: 16.5 seconds — ABNORMAL HIGH (ref 11.6–15.2)

## 2014-01-08 LAB — SURGICAL PCR SCREEN
MRSA, PCR: NEGATIVE
Staphylococcus aureus: NEGATIVE

## 2014-01-08 LAB — AMYLASE, PERITONEAL FLUID: AMYLASE, PERITONEAL FLUID: 25 U/L

## 2014-01-08 LAB — APTT: APTT: 39 s — AB (ref 24–37)

## 2014-01-08 MED ORDER — METRONIDAZOLE IN NACL 5-0.79 MG/ML-% IV SOLN
500.0000 mg | Freq: Three times a day (TID) | INTRAVENOUS | Status: DC
  Administered 2014-01-09 – 2014-01-12 (×10): 500 mg via INTRAVENOUS
  Filled 2014-01-08 (×10): qty 100

## 2014-01-08 MED ORDER — DIPHENHYDRAMINE HCL 12.5 MG/5ML PO ELIX: 12.5000 mg | ORAL_SOLUTION | Freq: Four times a day (QID) | ORAL | Status: DC | PRN

## 2014-01-08 MED ORDER — FENTANYL CITRATE 0.05 MG/ML IJ SOLN
INTRAMUSCULAR | Status: AC
  Filled 2014-01-08: qty 4

## 2014-01-08 MED ORDER — LEVOFLOXACIN IN D5W 750 MG/150ML IV SOLN
750.0000 mg | INTRAVENOUS | Status: DC
  Administered 2014-01-09 – 2014-01-10 (×2): 750 mg via INTRAVENOUS
  Filled 2014-01-08 (×4): qty 150

## 2014-01-08 MED ORDER — HYDROCODONE-ACETAMINOPHEN 5-325 MG PO TABS
1.0000 | ORAL_TABLET | ORAL | Status: DC | PRN
  Administered 2014-01-08 – 2014-01-09 (×2): 2 via ORAL
  Administered 2014-01-09: 1 via ORAL
  Administered 2014-01-10: 2 via ORAL
  Filled 2014-01-08 (×2): qty 1
  Filled 2014-01-08: qty 2
  Filled 2014-01-08 (×2): qty 1
  Filled 2014-01-08: qty 2
  Filled 2014-01-08: qty 1

## 2014-01-08 MED ORDER — PANTOPRAZOLE SODIUM 40 MG PO TBEC
40.0000 mg | DELAYED_RELEASE_TABLET | Freq: Every day | ORAL | Status: DC
  Administered 2014-01-08 – 2014-01-14 (×7): 40 mg via ORAL
  Filled 2014-01-08 (×9): qty 1

## 2014-01-08 MED ORDER — DIPHENHYDRAMINE HCL 50 MG/ML IJ SOLN: 12.5000 mg | Freq: Four times a day (QID) | INTRAMUSCULAR | Status: DC | PRN

## 2014-01-08 MED ORDER — MIDAZOLAM HCL 2 MG/2ML IJ SOLN
INTRAMUSCULAR | Status: AC | PRN
  Administered 2014-01-08: 0.5 mg via INTRAVENOUS
  Administered 2014-01-08: 1 mg via INTRAVENOUS
  Administered 2014-01-08 (×3): 0.5 mg via INTRAVENOUS
  Administered 2014-01-08: 1 mg via INTRAVENOUS

## 2014-01-08 MED ORDER — LEVOTHYROXINE SODIUM 112 MCG PO TABS
112.0000 ug | ORAL_TABLET | Freq: Every day | ORAL | Status: DC
  Administered 2014-01-08 – 2014-01-14 (×7): 112 ug via ORAL
  Filled 2014-01-08 (×8): qty 1

## 2014-01-08 MED ORDER — MIDAZOLAM HCL 2 MG/2ML IJ SOLN
INTRAMUSCULAR | Status: AC
Start: 2014-01-08 — End: 2014-01-08
  Filled 2014-01-08: qty 6

## 2014-01-08 MED ORDER — HYDROCODONE-ACETAMINOPHEN 5-325 MG PO TABS
1.0000 | ORAL_TABLET | ORAL | Status: DC | PRN
  Administered 2014-01-08 (×2): 1 via ORAL

## 2014-01-08 MED ORDER — MORPHINE SULFATE 2 MG/ML IJ SOLN: 2.0000 mg | INTRAMUSCULAR | Status: DC | PRN

## 2014-01-08 MED ORDER — ENOXAPARIN SODIUM 40 MG/0.4ML ~~LOC~~ SOLN
40.0000 mg | SUBCUTANEOUS | Status: DC
  Administered 2014-01-09 – 2014-01-14 (×5): 40 mg via SUBCUTANEOUS
  Filled 2014-01-08 (×8): qty 0.4

## 2014-01-08 MED ORDER — FENTANYL CITRATE 0.05 MG/ML IJ SOLN
INTRAMUSCULAR | Status: AC | PRN
  Administered 2014-01-08: 50 ug via INTRAVENOUS
  Administered 2014-01-08 (×2): 25 ug via INTRAVENOUS

## 2014-01-08 MED ORDER — ONDANSETRON HCL 4 MG/2ML IJ SOLN
4.0000 mg | Freq: Four times a day (QID) | INTRAMUSCULAR | Status: DC | PRN
  Administered 2014-01-11: 4 mg via INTRAVENOUS
  Filled 2014-01-08 (×2): qty 2

## 2014-01-08 MED ORDER — KCL IN DEXTROSE-NACL 20-5-0.45 MEQ/L-%-% IV SOLN
INTRAVENOUS | Status: DC
  Administered 2014-01-08: 1000 mL via INTRAVENOUS
  Administered 2014-01-08: 14:00:00 via INTRAVENOUS
  Administered 2014-01-09: 50 mL/h via INTRAVENOUS
  Administered 2014-01-09 – 2014-01-10 (×2): via INTRAVENOUS
  Administered 2014-01-12: 50 mL/h via INTRAVENOUS
  Administered 2014-01-13: 21:00:00 via INTRAVENOUS
  Filled 2014-01-08 (×10): qty 1000

## 2014-01-08 NOTE — Progress Notes (Signed)
<principal problem not specified>  Subjective: Pt admitted with LUQ abscess.  He is s/p robotic distal pancreatectomy    Objective: Vital signs in last 24 hours: Temp:  [97.9 F (36.6 C)-100.8 F (38.2 C)] 99.7 F (37.6 C) (11/28 0600) Pulse Rate:  [83-106] 93 (11/28 0600) Resp:  [16-24] 20 (11/28 0600) BP: (103-129)/(63-78) 108/65 mmHg (11/28 0600) SpO2:  [95 %-100 %] 100 % (11/28 0600) Weight:  [200 lb 6.4 oz (90.901 kg)] 200 lb 6.4 oz (90.901 kg) (11/28 0156) Last BM Date: 01/06/14  Intake/Output from previous day: 11/27 0701 - 11/28 0700 In: 0  Out: 800 [Urine:800] Intake/Output this shift: Total I/O In: 20 [P.O.:20] Out: -   General appearance: alert and cooperative GI: normal findings: soft, non-tender Incision/Wound: healing well  Lab Results:  Results for orders placed or performed during the hospital encounter of 01/07/14 (from the past 24 hour(s))  CBC with Differential     Status: Abnormal   Collection Time: 01/07/14  2:34 PM  Result Value Ref Range   WBC 23.8 (H) 4.0 - 10.5 K/uL   RBC 5.35 4.22 - 5.81 MIL/uL   Hemoglobin 14.4 13.0 - 17.0 g/dL   HCT 40.943.5 81.139.0 - 91.452.0 %   MCV 81.3 78.0 - 100.0 fL   MCH 26.9 26.0 - 34.0 pg   MCHC 33.1 30.0 - 36.0 g/dL   RDW 78.214.0 95.611.5 - 21.315.5 %   Platelets 320 150 - 400 K/uL   Neutrophils Relative % 80 (H) 43 - 77 %   Lymphocytes Relative 8 (L) 12 - 46 %   Monocytes Relative 12 3 - 12 %   Eosinophils Relative 0 0 - 5 %   Basophils Relative 0 0 - 1 %   Neutro Abs 19.0 (H) 1.7 - 7.7 K/uL   Lymphs Abs 1.9 0.7 - 4.0 K/uL   Monocytes Absolute 2.9 (H) 0.1 - 1.0 K/uL   Eosinophils Absolute 0.0 0.0 - 0.7 K/uL   Basophils Absolute 0.0 0.0 - 0.1 K/uL   Smear Review MORPHOLOGY UNREMARKABLE   Comprehensive metabolic panel     Status: Abnormal   Collection Time: 01/07/14  2:34 PM  Result Value Ref Range   Sodium 138 137 - 147 mEq/L   Potassium 4.7 3.7 - 5.3 mEq/L   Chloride 96 96 - 112 mEq/L   CO2 27 19 - 32 mEq/L   Glucose,  Bld 123 (H) 70 - 99 mg/dL   BUN 8 6 - 23 mg/dL   Creatinine, Ser 0.860.94 0.50 - 1.35 mg/dL   Calcium 9.4 8.4 - 57.810.5 mg/dL   Total Protein 7.7 6.0 - 8.3 g/dL   Albumin 3.2 (L) 3.5 - 5.2 g/dL   AST 26 0 - 37 U/L   ALT 42 0 - 53 U/L   Alkaline Phosphatase 161 (H) 39 - 117 U/L   Total Bilirubin 1.3 (H) 0.3 - 1.2 mg/dL   GFR calc non Af Amer >90 >90 mL/min   GFR calc Af Amer >90 >90 mL/min   Anion gap 15 5 - 15  Lipase, blood     Status: None   Collection Time: 01/07/14  5:50 PM  Result Value Ref Range   Lipase 24 11 - 59 U/L  I-Stat CG4 Lactic Acid, ED     Status: None   Collection Time: 01/07/14  6:05 PM  Result Value Ref Range   Lactic Acid, Venous 1.15 0.5 - 2.2 mmol/L  Urinalysis, Routine w reflex microscopic     Status: Abnormal  Collection Time: 01/07/14  6:44 PM  Result Value Ref Range   Color, Urine YELLOW YELLOW   APPearance CLOUDY (A) CLEAR   Specific Gravity, Urine 1.004 (L) 1.005 - 1.030   pH 6.5 5.0 - 8.0   Glucose, UA NEGATIVE NEGATIVE mg/dL   Hgb urine dipstick SMALL (A) NEGATIVE   Bilirubin Urine NEGATIVE NEGATIVE   Ketones, ur NEGATIVE NEGATIVE mg/dL   Protein, ur NEGATIVE NEGATIVE mg/dL   Urobilinogen, UA 0.2 0.0 - 1.0 mg/dL   Nitrite NEGATIVE NEGATIVE   Leukocytes, UA NEGATIVE NEGATIVE  Urine microscopic-add on     Status: None   Collection Time: 01/07/14  6:44 PM  Result Value Ref Range   Urine-Other      NO FORMED ELEMENTS SEEN ON URINE MICROSCOPIC EXAMINATION  Surgical pcr screen     Status: None   Collection Time: 01/08/14  2:45 AM  Result Value Ref Range   MRSA, PCR NEGATIVE NEGATIVE   Staphylococcus aureus NEGATIVE NEGATIVE  CBC     Status: Abnormal   Collection Time: 01/08/14  5:21 AM  Result Value Ref Range   WBC 17.4 (H) 4.0 - 10.5 K/uL   RBC 4.66 4.22 - 5.81 MIL/uL   Hemoglobin 12.2 (L) 13.0 - 17.0 g/dL   HCT 25.337.9 (L) 66.439.0 - 40.352.0 %   MCV 81.3 78.0 - 100.0 fL   MCH 26.2 26.0 - 34.0 pg   MCHC 32.2 30.0 - 36.0 g/dL   RDW 47.414.1 25.911.5 - 56.315.5 %    Platelets 260 150 - 400 K/uL  Protime-INR     Status: Abnormal   Collection Time: 01/08/14  5:21 AM  Result Value Ref Range   Prothrombin Time 16.5 (H) 11.6 - 15.2 seconds   INR 1.32 0.00 - 1.49  APTT     Status: Abnormal   Collection Time: 01/08/14  5:21 AM  Result Value Ref Range   aPTT 39 (H) 24 - 37 seconds     Studies/Results Radiology     MEDS, Scheduled . enoxaparin (LOVENOX) injection  40 mg Subcutaneous Q24H  . [START ON 01/09/2014] levofloxacin (LEVAQUIN) IV  750 mg Intravenous Q24H  . levothyroxine  112 mcg Oral QAC breakfast  . [START ON 01/09/2014] metronidazole  500 mg Intravenous Q8H  . pantoprazole  40 mg Oral Daily     Assessment: <principal problem not specified> LUQ abscess  Plan: To IR today for drain placement Cont IV Levaquin and Flagyl FLD once drain placed and awake from procedure     LOS: 1 day    Vanita PandaAlicia C Sherine Cortese, MD [facility], [place] [phone]   01/08/2014 8:48 AM

## 2014-01-08 NOTE — Consult Note (Signed)
Reason for consult: abdominal fluid collection  Referring Physician(s): CCS  History of Present Illness: Tyler Smith is a 53 y.o. 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 On 11/23/13 he underwent CT guided aspiration of a pancreatic bed fluid collection with negative cultures. He was recently treated for URI but has continued to have intermittent fevers, abd pain (primarily epigastric/LUQ) and leukocytosis. Repeat CT A/P on 01/07/14 reveals gas/fluid collection in pancreatic tail resection bed and left subdiaphragmatic space concerning for abscess. Request now received for CT guided drainage of this abdominal fluid collection.  Past Medical History  Diagnosis Date  . Thyroid disease   . Hypercholesterolemia   . GERD (gastroesophageal reflux disease)     none recent  . Hypothyroidism   . Pancreatic cyst     Past Surgical History  Procedure Laterality Date  . 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 Pinkie finger surgfery Right yrs ago  . Eus N/A 09/01/2013    Procedure: ESOPHAGEAL ENDOSCOPIC ULTRASOUND (EUS) RADIAL;  Surgeon: Willis Modena, MD;  Location: [facility];  Service: Endoscopy;  Laterality: N/A;  . Fine needle aspiration N/A 09/01/2013    Procedure: FINE NEEDLE ASPIRATION (FNA) LINEAR;  Surgeon: Willis Modena, MD;  Location: [facility];  Service: Endoscopy;  Laterality: N/A;    Allergies: Penicillins  Medications: Prior to Admission medications   Medication Sig Start Date End Date Taking? Authorizing Provider  acetaminophen (TYLENOL) 325 MG tablet Take 650 mg by mouth daily as needed for moderate pain.    Yes Historical Provider, MD  aspirin EC 81 MG tablet Take 81 mg by mouth at bedtime.    Yes Historical Provider, MD  HYDROcodone-acetaminophen (NORCO/VICODIN) 5-325 MG per tablet Take 1-2 tablets by  mouth every 4 (four) hours as needed for moderate pain or severe pain. 11/15/13  Yes Karie Soda, MD  ibuprofen (ADVIL,MOTRIN) 600 MG tablet Take 1 tablet (600 mg total) by mouth every 6 (six) hours as needed for fever, headache, mild pain or moderate pain. 11/15/13  Yes Karie Soda, MD  levothyroxine (SYNTHROID, LEVOTHROID) 112 MCG tablet Take 112 mcg by mouth daily before breakfast.   Yes Historical Provider, MD  pantoprazole (PROTONIX) 40 MG tablet Take 40 mg by mouth daily.  12/27/13  Yes Historical Provider, MD  pravastatin (PRAVACHOL) 40 MG tablet Take 40 mg by mouth at bedtime.    Yes Historical Provider, MD  azithromycin (ZITHROMAX) 250 MG tablet  12/28/13   Historical Provider, MD  levofloxacin (LEVAQUIN) 750 MG tablet Take 750 mg by mouth daily.    Historical Provider, MD  metroNIDAZOLE (FLAGYL) 500 MG tablet Take 500 mg by mouth 3 (three) times daily.    Historical Provider, MD  predniSONE (DELTASONE) 20 MG tablet  12/27/13   Historical Provider, MD    Family History  Problem Relation Age of Onset  . Cancer - Other Mother   . COPD Father   . Coronary artery disease Brother   . Coronary artery disease Brother     History   Social History  . Marital Status: Married    Spouse Name: N/A    Number of Children: N/A  . Years of Education: N/A   Social History Main Topics  . Smoking status: Never Smoker   . Smokeless tobacco: Never Used  . Alcohol Use: Yes  Comment: occasional  . Drug Use: No  . Sexual Activity: Yes   Other Topics Concern  . None   Social History Narrative         Review of Systems  see above  Vital Signs: BP 108/65 mmHg  Pulse 93  Temp(Src) 99.7 F (37.6 C) (Oral)  Resp 20  Ht 5\' 11"  (1.803 m)  Wt 200 lb 6.4 oz (90.901 kg)  BMI 27.96 kg/m2  SpO2 100%  Physical Exam pt awake/alert; chest- few left basilar rhonchi, right clear; abd- soft,+BS, mildly tender epigastric/LUQ regions; ext- FROM, no edema.  Imaging: Dg Chest 2  View  01/07/2014   CLINICAL DATA:  Cough, fever  EXAM: CHEST  2 VIEW  COMPARISON:  CT chest dated 11/18/2013  FINDINGS: Low lung volumes. Patchy bibasilar opacities, left greater than right, likely atelectasis. Left lower lobe pneumonia is not entirely excluded. No pleural effusion or pneumothorax.  The heart is normal in size.  Visualized osseous structures are within normal limits.  IMPRESSION: Low lung volumes with patchy bibasilar opacities, likely atelectasis.  Left lower lobe pneumonia not entirely excluded.   Electronically Signed   By: Charline BillsSriyesh  Krishnan M.D.   On: 01/07/2014 18:26   Ct Abdomen Pelvis W Contrast  01/07/2014   CLINICAL DATA:  53 year old male with persistent fever and chills. History of partial pancreatic resection. Recently finished antibiotic therapy on 01/01/2014.  EXAM: CT ABDOMEN AND PELVIS WITH CONTRAST  TECHNIQUE: Multidetector CT imaging of the abdomen and pelvis was performed using the standard protocol following bolus administration of intravenous contrast.  CONTRAST:  100mL OMNIPAQUE IOHEXOL 300 MG/ML  SOLN  COMPARISON:  Most recent prior CT scan of the abdomen and pelvis 11/22/2013 ; MRI of the abdomen 08/07/2013  FINDINGS: Lower Chest: Mild dependent atelectasis in the left lower lobe. Trace left pleural effusion. Visualized cardiac structures are within normal limits for size. No pericardial effusion. Unremarkable distal thoracic esophagus.  Abdomen: Unremarkable CT appearance of the stomach, duodenum, spleen, and adrenal glands. Normal hepatic contour and morphology. Hypoattenuating lesion in the posterior aspect of hepatic segment 6 consistent with hemangioma as identified on the prior MRI. Gallbladder is unremarkable. No intra or extrahepatic biliary ductal dilatation. Unremarkable appearance of the bilateral kidneys. No focal solid lesion, hydronephrosis or nephrolithiasis. No focal bowel wall thickening or evidence of obstruction. Normal appendix in the right lower  quadrant.  A mortise peripherally enhancing fluid and gas collection in the left subdiaphragmatic space extending into the pancreatic tail resection bed. The fluid collection is difficult to measure given its a amorphous shape. The pancreatic bed fluid collection measures approximately 5.7 x 3.1 cm. The larger collection in the left subdiaphragmatic space measures approximately 9.3 x 4.4 cm. Of note, the splenic flexure of the colon passes directly between the 2 fluid collections and the lumen of the splenic flexure appears to be contiguous with the fluid and gas collection in the pancreatic resection bed (axial image 27 series 2 ; sagittal image 101 series 4). There is an adjacent surgical clip. Small volume of perisplenic fluid and gas. Inflammatory stranding is present about the fluid collections in the left upper quadrant.  Pelvis: Unremarkable bladder, prostate gland and seminal vesicles. No free fluid or suspicious adenopathy.  Bones/Soft Tissues: No acute fracture or aggressive appearing lytic or blastic osseous lesion.  Vascular: The splenic vein is not well seen and is likely thrombosed. There are developing perigastric collaterals. No evidence of arterial pseudoaneurysm or occlusion.  IMPRESSION: 1. Peripherally enhancing fluid  and gas collection in the pancreatic tail resection bed and left subdiaphragmatic space which appears contiguous with the lumen of the splenic flexure of the colon. Imaging findings are concerning for abscess eroding into the adjacent colon versus contained colonic leak/perforation. 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 3. Trace left pleural effusion and associated lower lobe atelectasis. 4. Additional ancillary findings as above without significant interval change.   Electronically Signed   By: Malachy MoanHeath  McCullough M.D.   On: 01/07/2014 23:19    Labs:  CBC:  Recent Labs  11/18/13 1910 11/22/13 1256 01/07/14 1434 01/08/14 0521  WBC  13.6* 12.2* 23.8* 17.4*  HGB 12.8* 13.4 14.4 12.2*  HCT 37.5* 39.6 43.5 37.9*  PLT 429* 469* 320 260    COAGS:  Recent Labs  11/05/13 0940 11/22/13 1256 01/08/14 0521  INR 1.02 1.18 1.32  APTT  --   --  39*    BMP:  Recent Labs  11/14/13 0520 11/18/13 1910 11/22/13 1256 01/07/14 1434  NA 136* 135* 139 138  K 3.7 3.8 4.0 4.7  CL 98 95* 100 96  CO2 26 25 22 27   GLUCOSE 126* 116* 111* 123*  BUN 7 7 6 8   CALCIUM 8.4 8.8 9.2 9.4  CREATININE 0.76 0.86 0.94 0.94  GFRNONAA >90 >90 >90 >90  GFRAA >90 >90 >90 >90    LIVER FUNCTION TESTS:  Recent Labs  11/05/13 0940 11/12/13 0449 11/18/13 1910 01/07/14 1434  BILITOT 0.4 1.0 0.6 1.3*  AST 27 19 41* 26  ALT 32 24 76* 42  ALKPHOS 75 58 92 161*  PROT 8.1 6.5 7.1 7.7  ALBUMIN 4.2 2.8* 3.1* 3.2*    TUMOR MARKERS: No results for input(s): AFPTM, CEA, CA199, CHROMGRNA in the last 8760 hours.  Assessment and Plan: Tyler Smith is a 53 y.o. 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 On 11/23/13 he underwent CT guided aspiration of a pancreatic bed fluid collection with negative cultures. He was recently treated for URI but has continued to have intermittent fevers, abd pain (primarily epigastric/LUQ) and leukocytosis. Repeat CT A/P on 01/07/14 reveals gas/fluid collection in pancreatic tail resection bed and left subdiaphragmatic space concerning for abscess. Request now received for CT guided drainage of this abdominal fluid collection. Imaging studies have been reviewed by Dr. Sissy HoffHassell/case d/w Dr. Maisie Fushomas. Details/risks of procedure d/w pt/wife with their understanding and consent. Procedure tent scheduled for later today.        I spent a total of 20 minutes face to face in clinical consultation, greater than 50% of which was  counseling/coordinating care for abdominal fluid collection drainage.  Signed: Chinita PesterALLRED,D KEVIN 01/08/2014, 8:39 AM

## 2014-01-08 NOTE — Plan of Care (Signed)
Problem: Phase I Progression Outcomes Goal: Pain controlled with appropriate interventions Outcome: Completed/Met Date Met:  01/08/14 Goal: Incision/dressings dry and intact Outcome: Completed/Met Date Met:  01/08/14 Goal: Tubes/drains patent Outcome: Completed/Met Date Met:  01/08/14 Goal: Initial discharge plan identified Outcome: Completed/Met Date Met:  01/08/14 Goal: Voiding-avoid urinary catheter unless indicated Outcome: Completed/Met Date Met:  01/08/14

## 2014-01-08 NOTE — H&P (Signed)
Tyler Smith is an 53 y.o. male.   Chief Complaint: fevers HPI: Pt reports continued LUQ pain with inhalation that has occurred since lap distal pancreatectomy ~8 weeks ago.  He had a post op fluid collection that was aspirated ~6 weeks ago. This showed no bacterial growth on cultures. He began to develop fevers and night sweats ~3 weeks ago associated with cough.  He was treated for a possible URI with antibiotics by his PCP.  The fevers got better but returned after he came off of antibiotics.  He denies any diarrhea, blood in stool, constipation, nausea or vomiting.  He is having some slight abd discomfort after eating.    Past Medical History  Diagnosis Date  . Thyroid disease   . Hypercholesterolemia   . GERD (gastroesophageal reflux disease)     none recent  . Hypothyroidism   . Pancreatic cyst     Past Surgical History  Procedure Laterality Date  . Eye surgery Bilateral age 81    lazy eye  . Pinkie finger surgfery Right yrs ago  . Eus N/A 09/01/2013    Procedure: ESOPHAGEAL ENDOSCOPIC ULTRASOUND (EUS) RADIAL;  Surgeon: Arta Silence, MD;  Location: [facility];  Service: Endoscopy;  Laterality: N/A;  . Fine needle aspiration N/A 09/01/2013    Procedure: FINE NEEDLE ASPIRATION (FNA) LINEAR;  Surgeon: Arta Silence, MD;  Location: [facility];  Service: Endoscopy;  Laterality: N/A;    Family History  Problem Relation Age of Onset  . Cancer - Other Mother   . COPD Father   . Coronary artery disease Brother   . Coronary artery disease Brother    Social History:  reports that he has never smoked. He has never used smokeless tobacco. He reports that he drinks alcohol. He reports that he does not use illicit drugs.  Allergies:  Allergies  Allergen Reactions  . Penicillins Hives     (Not in a hospital admission)  Results for orders placed or performed during the hospital encounter of 01/07/14 (from the past 48 hour(s))  CBC with Differential     Status: Abnormal   Collection Time: 01/07/14  2:34 PM  Result Value Ref Range   WBC 23.8 (H) 4.0 - 10.5 K/uL   RBC 5.35 4.22 - 5.81 MIL/uL   Hemoglobin 14.4 13.0 - 17.0 g/dL   HCT 43.5 39.0 - 52.0 %   MCV 81.3 78.0 - 100.0 fL   MCH 26.9 26.0 - 34.0 pg   MCHC 33.1 30.0 - 36.0 g/dL   RDW 14.0 11.5 - 15.5 %   Platelets 320 150 - 400 K/uL   Neutrophils Relative % 80 (H) 43 - 77 %   Lymphocytes Relative 8 (L) 12 - 46 %   Monocytes Relative 12 3 - 12 %   Eosinophils Relative 0 0 - 5 %   Basophils Relative 0 0 - 1 %   Neutro Abs 19.0 (H) 1.7 - 7.7 K/uL   Lymphs Abs 1.9 0.7 - 4.0 K/uL   Monocytes Absolute 2.9 (H) 0.1 - 1.0 K/uL   Eosinophils Absolute 0.0 0.0 - 0.7 K/uL   Basophils Absolute 0.0 0.0 - 0.1 K/uL   Smear Review MORPHOLOGY UNREMARKABLE   Comprehensive metabolic panel     Status: Abnormal   Collection Time: 01/07/14  2:34 PM  Result Value Ref Range   Sodium 138 137 - 147 mEq/L   Potassium 4.7 3.7 - 5.3 mEq/L   Chloride 96 96 - 112 mEq/L   CO2 27 19 -  32 mEq/L   Glucose, Bld 123 (H) 70 - 99 mg/dL   BUN 8 6 - 23 mg/dL   Creatinine, Ser 0.94 0.50 - 1.35 mg/dL   Calcium 9.4 8.4 - 10.5 mg/dL   Total Protein 7.7 6.0 - 8.3 g/dL   Albumin 3.2 (L) 3.5 - 5.2 g/dL   AST 26 0 - 37 U/L   ALT 42 0 - 53 U/L   Alkaline Phosphatase 161 (H) 39 - 117 U/L   Total Bilirubin 1.3 (H) 0.3 - 1.2 mg/dL   GFR calc non Af Amer >90 >90 mL/min   GFR calc Af Amer >90 >90 mL/min    Comment: (NOTE) The eGFR has been calculated using the CKD EPI equation. This calculation has not been validated in all clinical situations. eGFR's persistently <90 mL/min signify possible Chronic Kidney Disease.    Anion gap 15 5 - 15  Lipase, blood     Status: None   Collection Time: 01/07/14  5:50 PM  Result Value Ref Range   Lipase 24 11 - 59 U/L  I-Stat CG4 Lactic Acid, ED     Status: None   Collection Time: 01/07/14  6:05 PM  Result Value Ref Range   Lactic Acid, Venous 1.15 0.5 - 2.2 mmol/L  Urinalysis, Routine w reflex  microscopic     Status: Abnormal   Collection Time: 01/07/14  6:44 PM  Result Value Ref Range   Color, Urine YELLOW YELLOW   APPearance CLOUDY (A) CLEAR   Specific Gravity, Urine 1.004 (L) 1.005 - 1.030   pH 6.5 5.0 - 8.0   Glucose, UA NEGATIVE NEGATIVE mg/dL   Hgb urine dipstick SMALL (A) NEGATIVE   Bilirubin Urine NEGATIVE NEGATIVE   Ketones, ur NEGATIVE NEGATIVE mg/dL   Protein, ur NEGATIVE NEGATIVE mg/dL   Urobilinogen, UA 0.2 0.0 - 1.0 mg/dL   Nitrite NEGATIVE NEGATIVE   Leukocytes, UA NEGATIVE NEGATIVE  Urine microscopic-add on     Status: None   Collection Time: 01/07/14  6:44 PM  Result Value Ref Range   Urine-Other      NO FORMED ELEMENTS SEEN ON URINE MICROSCOPIC EXAMINATION   Dg Chest 2 View  01/07/2014   CLINICAL DATA:  Cough, fever  EXAM: CHEST  2 VIEW  COMPARISON:  CT chest dated 11/18/2013  FINDINGS: Low lung volumes. Patchy bibasilar opacities, left greater than right, likely atelectasis. Left lower lobe pneumonia is not entirely excluded. No pleural effusion or pneumothorax.  The heart is normal in size.  Visualized osseous structures are within normal limits.  IMPRESSION: Low lung volumes with patchy bibasilar opacities, likely atelectasis.  Left lower lobe pneumonia not entirely excluded.   Electronically Signed   By: Julian Hy M.D.   On: 01/07/2014 18:26   Ct Abdomen Pelvis W Contrast  01/07/2014   CLINICAL DATA:  53 year old male with persistent fever and chills. History of partial pancreatic resection. Recently finished antibiotic therapy on 01/01/2014.  EXAM: CT ABDOMEN AND PELVIS WITH CONTRAST  TECHNIQUE: Multidetector CT imaging of the abdomen and pelvis was performed using the standard protocol following bolus administration of intravenous contrast.  CONTRAST:  11m OMNIPAQUE IOHEXOL 300 MG/ML  SOLN  COMPARISON:  Most recent prior CT scan of the abdomen and pelvis 11/22/2013 ; MRI of the abdomen 08/07/2013  FINDINGS: Lower Chest: Mild dependent  atelectasis in the left lower lobe. Trace left pleural effusion. Visualized cardiac structures are within normal limits for size. No pericardial effusion. Unremarkable distal thoracic esophagus.  Abdomen: Unremarkable  CT appearance of the stomach, duodenum, spleen, and adrenal glands. Normal hepatic contour and morphology. Hypoattenuating lesion in the posterior aspect of hepatic segment 6 consistent with hemangioma as identified on the prior MRI. Gallbladder is unremarkable. No intra or extrahepatic biliary ductal dilatation. Unremarkable appearance of the bilateral kidneys. No focal solid lesion, hydronephrosis or nephrolithiasis. No focal bowel wall thickening or evidence of obstruction. Normal appendix in the right lower quadrant.  A mortise peripherally enhancing fluid and gas collection in the left subdiaphragmatic space extending into the pancreatic tail resection bed. The fluid collection is difficult to measure given its a amorphous shape. The pancreatic bed fluid collection measures approximately 5.7 x 3.1 cm. The larger collection in the left subdiaphragmatic space measures approximately 9.3 x 4.4 cm. Of note, the splenic flexure of the colon passes directly between the 2 fluid collections and the lumen of the splenic flexure appears to be contiguous with the fluid and gas collection in the pancreatic resection bed (axial image 27 series 2 ; sagittal image 101 series 4). There is an adjacent surgical clip. Small volume of perisplenic fluid and gas. Inflammatory stranding is present about the fluid collections in the left upper quadrant.  Pelvis: Unremarkable bladder, prostate gland and seminal vesicles. No free fluid or suspicious adenopathy.  Bones/Soft Tissues: No acute fracture or aggressive appearing lytic or blastic osseous lesion.  Vascular: The splenic vein is not well seen and is likely thrombosed. There are developing perigastric collaterals. No evidence of arterial pseudoaneurysm or occlusion.   IMPRESSION: 1. Peripherally enhancing fluid and gas collection in the pancreatic tail resection bed and left subdiaphragmatic space which appears contiguous with the lumen of the splenic flexure of the colon. Imaging findings are concerning for abscess eroding into the adjacent colon versus contained colonic leak/perforation. 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 3. Trace left pleural effusion and associated lower lobe atelectasis. 4. Additional ancillary findings as above without significant interval change.   Electronically Signed   By: Jacqulynn Cadet M.D.   On: 01/07/2014 23:19    Review of Systems  Constitutional: Positive for fever and chills.  Eyes: Negative for blurred vision.  Respiratory: Negative for cough, sputum production and shortness of breath.        LUQ pain with deep inhalation  Cardiovascular: Negative for chest pain and palpitations.  Gastrointestinal: Negative for nausea, vomiting, abdominal pain, diarrhea, constipation and blood in stool.  Genitourinary: Negative for dysuria, urgency and frequency.  Neurological: Negative for dizziness and headaches.    Blood pressure 103/63, pulse 83, temperature 98.2 F (36.8 C), temperature source Oral, resp. rate 24, SpO2 97 %. Physical Exam  Constitutional: He is oriented to person, place, and time. He appears well-developed and well-nourished. No distress.  HENT:  Head: Normocephalic and atraumatic.  Eyes: Conjunctivae are normal. Pupils are equal, round, and reactive to light.  Neck: Normal range of motion. Neck supple.  Cardiovascular: Normal rate and regular rhythm.   Respiratory: Effort normal and breath sounds normal.  GI: Soft. He exhibits no distension. There is no tenderness.  Musculoskeletal: Normal range of motion.  Neurological: He is alert and oriented to person, place, and time.  Skin: Skin is warm and dry. He is not diaphoretic.     Assessment/Plan This is a 53 y.o. M  with a LUQ abscess after distal pancreatectomy 8 weeks ago.  We will admit to the hospital and start Levaquin and Flagyl.  If his wbc does not respond to this,  I will switch his antibiotic to Aztreonam.  I will consult IR for placement of a CT guided drain.  NPO, IVF's, pain control.      Brinden Kincheloe C. 16/75/6125, 12:10 AM

## 2014-01-08 NOTE — ED Notes (Signed)
Dr. Juleen ChinaKohut advised to hold antibiotics until surgeon calls back reference admission of patient.

## 2014-01-08 NOTE — Procedures (Signed)
CT guided LUQ 4519f drain placed 8ml tan-red fluid out, for cx and amy No complication No blood loss. See complete dictation in [facility].

## 2014-01-08 NOTE — Progress Notes (Signed)
Re: Fever.  Contacted Dr. Irene LimboGoodrich ref pt's increased temperature.  MD said this was related to complexity of procedure and no additional orders issued at this time.  Will continue to monitor and advise night shift.

## 2014-01-09 MED ORDER — ACETAMINOPHEN 325 MG PO TABS
650.0000 mg | ORAL_TABLET | ORAL | Status: DC | PRN
  Administered 2014-01-10 – 2014-01-11 (×2): 650 mg via ORAL
  Filled 2014-01-09 (×2): qty 2

## 2014-01-09 NOTE — Progress Notes (Signed)
Made A. Maisie Fushomas, MD aware of Fever 102.6 this pm. Orders received.

## 2014-01-09 NOTE — Plan of Care (Signed)
Problem: Phase I Progression Outcomes Goal: OOB as tolerated unless otherwise ordered Outcome: Progressing Ambulated in hall today.  Encouraged to get up into chair, but declined today. Goal: Sutures/staples intact Outcome: Completed/Met Date Met:  01/09/14 Goal: Vital signs/hemodynamically stable Outcome: Not Progressing Increased temperature continues.  MD aware.  Problem: Phase II Progression Outcomes Goal: Pain controlled Outcome: Completed/Met Date Met:  01/09/14 Goal: Surgical site without signs of infection Outcome: Completed/Met Date Met:  01/09/14 Goal: Dressings dry/intact Outcome: Completed/Met Date Met:  01/09/14 Goal: Sutures/staples intact Outcome: Completed/Met Date Met:  01/09/14 Goal: Return of bowel function (flatus, BM) IF ABDOMINAL SURGERY:  Outcome: Completed/Met Date Met:  01/09/14 Goal: Foley discontinued Outcome: Not Applicable Date Met:  64/84/72 Goal: Tolerating diet Outcome: Completed/Met Date Met:  01/09/14

## 2014-01-09 NOTE — Progress Notes (Signed)
Patient ID: Tyler Smith, male   DOB: 1960-10-29, 53 y.o.   MRN: [id number]   Referring Physician(s): CCS  Subjective:  Pt had transient bacteremic episode post drain placement yesterday; feeling a little better today; denies sig abd pain,N/V  Allergies: Penicillins  Medications: Prior to Admission medications   Medication Sig Start Date End Date Taking? Authorizing Provider  acetaminophen (TYLENOL) 325 MG tablet Take 650 mg by mouth daily as needed for moderate pain.    Yes Historical Provider, MD  aspirin EC 81 MG tablet Take 81 mg by mouth at bedtime.    Yes Historical Provider, MD  HYDROcodone-acetaminophen (NORCO/VICODIN) 5-325 MG per tablet Take 1-2 tablets by mouth every 4 (four) hours as needed for moderate pain or severe pain. 11/15/13  Yes Karie Soda, MD  ibuprofen (ADVIL,MOTRIN) 600 MG tablet Take 1 tablet (600 mg total) by mouth every 6 (six) hours as needed for fever, headache, mild pain or moderate pain. 11/15/13  Yes Karie Soda, MD  levothyroxine (SYNTHROID, LEVOTHROID) 112 MCG tablet Take 112 mcg by mouth daily before breakfast.   Yes Historical Provider, MD  pantoprazole (PROTONIX) 40 MG tablet Take 40 mg by mouth daily.  12/27/13  Yes Historical Provider, MD  pravastatin (PRAVACHOL) 40 MG tablet Take 40 mg by mouth at bedtime.    Yes Historical Provider, MD  azithromycin (ZITHROMAX) 250 MG tablet  12/28/13   Historical Provider, MD  levofloxacin (LEVAQUIN) 750 MG tablet Take 750 mg by mouth daily.    Historical Provider, MD  metroNIDAZOLE (FLAGYL) 500 MG tablet Take 500 mg by mouth 3 (three) times daily.    Historical Provider, MD  predniSONE (DELTASONE) 20 MG tablet  12/27/13   Historical Provider, MD    Review of Systems see above  Vital Signs: BP 118/82 mmHg  Pulse 98  Temp(Src) 100 F (37.8 C) (Oral)  Resp 20  Ht 5\' 11"  (1.803 m)  Wt 200 lb 6.4 oz (90.901 kg)  BMI 27.96 kg/m2  SpO2 95%  Physical Exam LUQ drain intact, insertion site ok,NT; cx's pend;  output about 20 cc's; drain flushed with 5 cc's sterile NS with return of turbid, light yellow fluid  Imaging: Dg Chest 2 View  01/07/2014   CLINICAL DATA:  Cough, fever  EXAM: CHEST  2 VIEW  COMPARISON:  CT chest dated 11/18/2013  FINDINGS: Low lung volumes. Patchy bibasilar opacities, left greater than right, likely atelectasis. Left lower lobe pneumonia is not entirely excluded. No pleural effusion or pneumothorax.  The heart is normal in size.  Visualized osseous structures are within normal limits.  IMPRESSION: Low lung volumes with patchy bibasilar opacities, likely atelectasis.  Left lower lobe pneumonia not entirely excluded.   Electronically Signed   By: Charline Bills M.D.   On: 01/07/2014 18:26   Ct Abdomen Pelvis W Contrast  01/07/2014   CLINICAL DATA:  53 year old male with persistent fever and chills. History of partial pancreatic resection. Recently finished antibiotic therapy on 01/01/2014.  EXAM: CT ABDOMEN AND PELVIS WITH CONTRAST  TECHNIQUE: Multidetector CT imaging of the abdomen and pelvis was performed using the standard protocol following bolus administration of intravenous contrast.  CONTRAST:  OMNIPAQUE IOHEXOL 300 MG/ML  SOLN  COMPARISON:  Most recent prior CT scan of the abdomen and pelvis 11/22/2013 ; MRI of the abdomen 08/07/2013  FINDINGS: Lower Chest: Mild dependent atelectasis in the left lower lobe. Trace left pleural effusion. Visualized cardiac structures are within normal limits for size. No pericardial effusion. Unremarkable distal  thoracic esophagus.  Abdomen: Unremarkable CT appearance of the stomach, duodenum, spleen, and adrenal glands. Normal hepatic contour and morphology. Hypoattenuating lesion in the posterior aspect of hepatic segment 6 consistent with hemangioma as identified on the prior MRI. Gallbladder is unremarkable. No intra or extrahepatic biliary ductal dilatation. Unremarkable appearance of the bilateral kidneys. No focal solid lesion,  hydronephrosis or nephrolithiasis. No focal bowel wall thickening or evidence of obstruction. Normal appendix in the right lower quadrant.  A mortise peripherally enhancing fluid and gas collection in the left subdiaphragmatic space extending into the pancreatic tail resection bed. The fluid collection is difficult to measure given its a amorphous shape. The pancreatic bed fluid collection measures approximately 5.7 x 3.1 cm. The larger collection in the left subdiaphragmatic space measures approximately 9.3 x 4.4 cm. Of note, the splenic flexure of the colon passes directly between the 2 fluid collections and the lumen of the splenic flexure appears to be contiguous with the fluid and gas collection in the pancreatic resection bed (axial image 27 series 2 ; sagittal image 101 series 4). There is an adjacent surgical clip. Small volume of perisplenic fluid and gas. Inflammatory stranding is present about the fluid collections in the left upper quadrant.  Pelvis: Unremarkable bladder, prostate gland and seminal vesicles. No free fluid or suspicious adenopathy.  Bones/Soft Tissues: No acute fracture or aggressive appearing lytic or blastic osseous lesion.  Vascular: The splenic vein is not well seen and is likely thrombosed. There are developing perigastric collaterals. No evidence of arterial pseudoaneurysm or occlusion.  IMPRESSION: 1. Peripherally enhancing fluid and gas collection in the pancreatic tail resection bed and left subdiaphragmatic space which appears contiguous with the lumen of the splenic flexure of the colon. Imaging findings are concerning for abscess eroding into the adjacent colon versus contained colonic leak/perforation. 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 3. Trace left pleural effusion and associated lower lobe atelectasis. 4. Additional ancillary findings as above without significant interval change.   Electronically Signed   By: Malachy MoanHeath  McCullough M.D.    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 Persistent fever and chills, with demonstration of extraluminal gas and fluid collection in the resection bed and subdiaphragmatic space.  EXAM: CT GUIDED PERITONEAL DRAIN CATHETER PLACEMENT  ANESTHESIA/SEDATION: Intravenous Fentanyl and Versed were administered as conscious sedation during continuous cardiorespiratory monitoring by the radiology RN, with a total moderate sedation time of 22 minutes.  PROCEDURE: The procedure risks, benefits, and alternatives were explained to the patient. Questions regarding the procedure were encouraged and answered. The patient understands and consents to the procedure.  Select axial scans through the upper abdomen were obtained. The collection was localized and an appropriate skin entry site was determined. Site was marked.  The operative site Was prepped with Betadinein a sterile fashion, and a sterile drape was applied covering the operative field. A sterile gown and sterile gloves were used for the procedure. Local anesthesia was provided with 1% Lidocaine.  Under angled CT fluoroscopy, an 18 gauge needle was advanced into the collection. Purulent material could be aspirated. An Amplatz guidewire advanced easily within the collection, its position confirmed on CT fluoro. Tract dilated to facilitate placement of a 12 French pigtail catheter, formed within the central aspect of the collection. Drain catheter position confirmed on CT. The catheter was secured externally with 0 Prolene  suture and StatLock, and placed to gravity bag. A sample of the aspirate was sent for Gram stain, culture and sensitivity, and amylase.  Complications: None immediate  FINDINGS: The limited upper abdominal CT confirms persistent multiloculated gas and fluid collection in the left subdiaphragmatic space extending  anterolateral to the splenic flexure of the colon. The dominant peripheral component was accessed from a subcostal approach allowing 12 French pigtail drain catheter placement. Approximately 10 mL of thin tan red fluid were aspirated, sent for the requested studies.  IMPRESSION: 1. Technically successful left upper quadrant peritoneal drain catheter placement under CT guidance.   Electronically Signed   By: Oley Balmaniel  Hassell M.D.   On: 01/08/2014 14:43    Labs:  CBC:  Recent Labs  11/18/13 1910 11/22/13 1256 01/07/14 1434 01/08/14 0521  WBC 13.6* 12.2* 23.8* 17.4*  HGB 12.8* 13.4 14.4 12.2*  HCT 37.5* 39.6 43.5 37.9*  PLT 429* 469* 320 260    COAGS:  Recent Labs  11/05/13 0940 11/22/13 1256 01/08/14 0521  INR 1.02 1.18 1.32  APTT  --   --  39*    BMP:  Recent Labs  11/14/13 0520 11/18/13 1910 11/22/13 1256 01/07/14 1434  NA 136* 135* 139 138  K 3.7 3.8 4.0 4.7  CL 98 95* 100 96  CO2 26 25 22 27   GLUCOSE 126* 116* 111* 123*  BUN 7 7 6 8   CALCIUM 8.4 8.8 9.2 9.4  CREATININE 0.76 0.86 0.94 0.94  GFRNONAA >90 >90 >90 >90  GFRAA >90 >90 >90 >90    LIVER FUNCTION TESTS:  Recent Labs  11/05/13 0940 11/12/13 0449 11/18/13 1910 01/07/14 1434  BILITOT 0.4 1.0 0.6 1.3*  AST 27 19 41* 26  ALT 32 24 76* 42  ALKPHOS 75 58 92 161*  PROT 8.1 6.5 7.1 7.7  ALBUMIN 4.2 2.8* 3.1* 3.2*    Assessment and Plan: S/p LUQ fluid collection drainage 11/28; check final cx's; monitor labs/drain output ; cont drain irrigation; check f/u CT next week       I spent a total of 15 minutes face to face in clinical consultation/evaluation, greater than 50% of which was counseling/coordinating care for LUQ fluid collection drainage  Signed: Chinita PesterALLRED,D KEVIN 01/09/2014, 8:29 AM

## 2014-01-09 NOTE — Progress Notes (Signed)
Abscess of abdominal cavity  Subjective: Pt admitted with LUQ abscess.  He is s/p robotic distal pancreatectomy 2 months ago.  CT guided drain placed yesterday.  Pt had a bit of septic response.  This is somewhat better now.  He denies abd pain.    Objective: Vital signs in last 24 hours: Temp:  [98.4 F (36.9 C)-102.7 F (39.3 C)] 100 F (37.8 C) (11/29 0548) Pulse Rate:  [87-103] 98 (11/29 0204) Resp:  [19-37] 20 (11/29 0204) BP: (95-138)/(53-86) 118/82 mmHg (11/29 0204) SpO2:  [93 %-97 %] 95 % (11/29 0204) Last BM Date: 01/06/14  Intake/Output from previous day: 11/28 0701 - 11/29 0700 In: 3070 [P.O.:260; I.V.:2450; IV Piggyback:350] Out: 1392.5 [Urine:1375; Drains:17.5] Intake/Output this shift:    General appearance: alert and cooperative GI: normal findings: soft, non-tender Incision/Wound: healing well Drain with scant purulent fluid  Lab Results:  Results for orders placed or performed during the hospital encounter of 01/07/14 (from the past 24 hour(s))  Culture, routine-abscess     Status: None (Preliminary result)   Collection Time: 01/08/14  1:11 PM  Result Value Ref Range   Specimen Description DRAINAGE    Special Requests Normal    Gram Stain      MODERATE WBC PRESENT, PREDOMINANTLY PMN NO SQUAMOUS EPITHELIAL CELLS SEEN ABUNDANT GRAM NEGATIVE RODS FEW GRAM VARIABLE ROD Performed at [facility]    Culture      NO GROWTH 1 DAY Performed at [facility]    Report Status PENDING   Amylase, Peritoneal Fluid     Status: None   Collection Time: 01/08/14  1:15 PM  Result Value Ref Range   Amylase, peritoneal fluid 25 U/L     Studies/Results Radiology     MEDS, Scheduled . enoxaparin (LOVENOX) injection  40 mg Subcutaneous Q24H  . levofloxacin (LEVAQUIN) IV  750 mg Intravenous Q24H  . levothyroxine  112 mcg Oral QAC breakfast  . metronidazole  500 mg Intravenous Q8H  . pantoprazole  40 mg Oral Daily     Assessment: Abscess  of abdominal cavity LUQ abscess  Plan: Cont drain Cont IV Levaquin and Flagyl.   Recheck labs in AM Advance to reg diet  Will need repeat CT to eval for resolution   LOS: 2 days    Vanita PandaAlicia C Liesl Simons, MD [facility] [place] [phone]   01/09/2014 8:15 AM

## 2014-01-10 LAB — BASIC METABOLIC PANEL
Anion gap: 10 (ref 5–15)
BUN: 4 mg/dL — ABNORMAL LOW (ref 6–23)
CALCIUM: 8.7 mg/dL (ref 8.4–10.5)
CO2: 27 mEq/L (ref 19–32)
Chloride: 92 mEq/L — ABNORMAL LOW (ref 96–112)
Creatinine, Ser: 0.86 mg/dL (ref 0.50–1.35)
GFR calc Af Amer: >90 mL/min (ref >90)
GFR calc non Af Amer: >90 mL/min (ref >90)
GLUCOSE: 144 mg/dL — AB (ref 70–99)
Potassium: 4 mEq/L (ref 3.7–5.3)
SODIUM: 129 meq/L — AB (ref 137–147)

## 2014-01-10 LAB — CBC
HCT: 36.1 % — ABNORMAL LOW (ref 39.0–52.0)
HEMOGLOBIN: 11.7 g/dL — AB (ref 13.0–17.0)
MCH: 25.7 pg — ABNORMAL LOW (ref 26.0–34.0)
MCHC: 32.4 g/dL (ref 30.0–36.0)
MCV: 79.3 fL (ref 78.0–100.0)
PLATELETS: 231 10*3/uL (ref 150–400)
RBC: 4.55 MIL/uL (ref 4.22–5.81)
RDW: 14 % (ref 11.5–15.5)
WBC: 14.6 10*3/uL — ABNORMAL HIGH (ref 4.0–10.5)

## 2014-01-10 NOTE — Progress Notes (Signed)
Patient ID: Tyler Smith, male   DOB: 04/14/1960, 53 y.o.   MRN: [id number] Abscess of abdominal cavity  Subjective: Pt still febrile yesterday despite drain placement on Saturday.    Objective: Vital signs in last 24 hours: Temp:  [98.6 F (37 C)-102.6 F (39.2 C)] 100.4 F (38 C) (11/30 1400) Pulse Rate:  [87-104] 87 (11/30 1400) Resp:  [18-20] 18 (11/30 1400) BP: (106-121)/(65-80) 115/80 mmHg (11/30 1400) SpO2:  [95 %-98 %] 95 % (11/30 1400) Last BM Date: 01/09/14  Intake/Output from previous day: 11/29 0701 - 11/30 0700 In: 2585 [P.O.:720; I.V.:1500; IV Piggyback:350] Out: 1855 [Urine:1850; Drains:5] Intake/Output this shift: Total I/O In: 360 [P.O.:360] Out: 425 [Urine:400; Drains:25]  General appearance: alert and cooperative GI: normal findings: soft, non-tender Incision/Wound: healing well Drain with scant purulent fluid  Lab Results:  Results for orders placed or performed during the hospital encounter of 01/07/14 (from the past 24 hour(s))  CBC     Status: Abnormal   Collection Time: 01/10/14  5:31 AM  Result Value Ref Range   WBC 14.6 (H) 4.0 - 10.5 K/uL   RBC 4.55 4.22 - 5.81 MIL/uL   Hemoglobin 11.7 (L) 13.0 - 17.0 g/dL   HCT 95.636.1 (L) 38.739.0 - 56.452.0 %   MCV 79.3 78.0 - 100.0 fL   MCH 25.7 (L) 26.0 - 34.0 pg   MCHC 32.4 30.0 - 36.0 g/dL   RDW 33.214.0 95.111.5 - 88.415.5 %   Platelets 231 150 - 400 K/uL  Basic metabolic panel     Status: Abnormal   Collection Time: 01/10/14  5:31 AM  Result Value Ref Range   Sodium 129 (L) 137 - 147 mEq/L   Potassium 4.0 3.7 - 5.3 mEq/L   Chloride 92 (L) 96 - 112 mEq/L   CO2 27 19 - 32 mEq/L   Glucose, Bld 144 (H) 70 - 99 mg/dL   BUN 4 (L) 6 - 23 mg/dL   Creatinine, Ser 1.660.86 0.50 - 1.35 mg/dL   Calcium 8.7 8.4 - 06.310.5 mg/dL   GFR calc non Af Amer >90 >90 mL/min   GFR calc Af Amer >90 >90 mL/min   Anion gap 10 5 - 15     Studies/Results Radiology     MEDS, Scheduled . enoxaparin (LOVENOX) injection  40 mg Subcutaneous  Q24H  . levofloxacin (LEVAQUIN) IV  750 mg Intravenous Q24H  . levothyroxine  112 mcg Oral QAC breakfast  . metronidazole  500 mg Intravenous Q8H  . pantoprazole  40 mg Oral Daily     Assessment: Abscess of abdominal cavity LUQ abscess  Plan: Cont drain Cont IV Levaquin and Flagyl.   Discuss scans with IR.  Consider contrast study.   Recheck labs in AM Advance to reg diet Consider repeat blood cultures if high fevers occur again.    Will need repeat CT to eval for resolution   LOS: 3 days   01/10/2014 4:02 PM

## 2014-01-10 NOTE — Plan of Care (Signed)
Problem: Phase II Progression Outcomes Goal: Progress activity as tolerated unless otherwise ordered Outcome: Progressing     

## 2014-01-10 NOTE — Progress Notes (Signed)
Referring Physician(s): CCS  Subjective:  LUQ abscess Drain placed 11/28 Feels better today Ambulating Eating solids  Allergies: Penicillins  Medications: Prior to Admission medications   Medication Sig Start Date End Date Taking? Authorizing Provider  acetaminophen (TYLENOL) 325 MG tablet Take 650 mg by mouth daily as needed for moderate pain.    Yes Historical Provider, MD  aspirin EC 81 MG tablet Take 81 mg by mouth at bedtime.    Yes Historical Provider, MD  HYDROcodone-acetaminophen (NORCO/VICODIN) 5-325 MG per tablet Take 1-2 tablets by mouth every 4 (four) hours as needed for moderate pain or severe pain. 11/15/13  Yes Karie Soda, MD  ibuprofen (ADVIL,MOTRIN) 600 MG tablet Take 1 tablet (600 mg total) by mouth every 6 (six) hours as needed for fever, headache, mild pain or moderate pain. 11/15/13  Yes Karie Soda, MD  levothyroxine (SYNTHROID, LEVOTHROID) 112 MCG tablet Take 112 mcg by mouth daily before breakfast.   Yes Historical Provider, MD  pantoprazole (PROTONIX) 40 MG tablet Take 40 mg by mouth daily.  12/27/13  Yes Historical Provider, MD  pravastatin (PRAVACHOL) 40 MG tablet Take 40 mg by mouth at bedtime.    Yes Historical Provider, MD  azithromycin (ZITHROMAX) 250 MG tablet  12/28/13   Historical Provider, MD  levofloxacin (LEVAQUIN) 750 MG tablet Take 750 mg by mouth daily.    Historical Provider, MD  metroNIDAZOLE (FLAGYL) 500 MG tablet Take 500 mg by mouth 3 (three) times daily.    Historical Provider, MD  predniSONE (DELTASONE) 20 MG tablet  12/27/13   Historical Provider, MD    Review of Systems  Vital Signs: BP 121/65 mmHg  Pulse 87  Temp(Src) 98.6 F (37 C) (Oral)  Resp 20  Ht 5\' 11"  (1.803 m)  Wt 90.901 kg (200 lb 6.4 oz)  BMI 27.96 kg/m2  SpO2 96%  Physical Exam  Abdominal:  L abd abscess drain intact NT; no bleeding no sign of infection Clean and dry  Afeb; vss Wbc 14.6 (17.4) Cx: abundant GNR    Imaging: Dg Chest 2  View  01/07/2014   CLINICAL DATA:  Cough, fever  EXAM: CHEST  2 VIEW  COMPARISON:  CT chest dated 11/18/2013  FINDINGS: Low lung volumes. Patchy bibasilar opacities, left greater than right, likely atelectasis. Left lower lobe pneumonia is not entirely excluded. No pleural effusion or pneumothorax.  The heart is normal in size.  Visualized osseous structures are within normal limits.  IMPRESSION: Low lung volumes with patchy bibasilar opacities, likely atelectasis.  Left lower lobe pneumonia not entirely excluded.   Electronically Signed   By: Charline Bills M.D.   On: 01/07/2014 18:26   Ct Abdomen Pelvis W Contrast  01/07/2014   CLINICAL DATA:  53 year old male with persistent fever and chills. History of partial pancreatic resection. Recently finished antibiotic therapy on 01/01/2014.  EXAM: CT ABDOMEN AND PELVIS WITH CONTRAST  TECHNIQUE: Multidetector CT imaging of the abdomen and pelvis was performed using the standard protocol following bolus administration of intravenous contrast.  CONTRAST:  OMNIPAQUE IOHEXOL 300 MG/ML  SOLN  COMPARISON:  Most recent prior CT scan of the abdomen and pelvis 11/22/2013 ; MRI of the abdomen 08/07/2013  FINDINGS: Lower Chest: Mild dependent atelectasis in the left lower lobe. Trace left pleural effusion. Visualized cardiac structures are within normal limits for size. No pericardial effusion. Unremarkable distal thoracic esophagus.  Abdomen: Unremarkable CT appearance of the stomach, duodenum, spleen, and adrenal glands. Normal hepatic contour and morphology. Hypoattenuating lesion in  the posterior aspect of hepatic segment 6 consistent with hemangioma as identified on the prior MRI. Gallbladder is unremarkable. No intra or extrahepatic biliary ductal dilatation. Unremarkable appearance of the bilateral kidneys. No focal solid lesion, hydronephrosis or nephrolithiasis. No focal bowel wall thickening or evidence of obstruction. Normal appendix in the right lower  quadrant.  A mortise peripherally enhancing fluid and gas collection in the left subdiaphragmatic space extending into the pancreatic tail resection bed. The fluid collection is difficult to measure given its a amorphous shape. The pancreatic bed fluid collection measures approximately 5.7 x 3.1 cm. The larger collection in the left subdiaphragmatic space measures approximately 9.3 x 4.4 cm. Of note, the splenic flexure of the colon passes directly between the 2 fluid collections and the lumen of the splenic flexure appears to be contiguous with the fluid and gas collection in the pancreatic resection bed (axial image 27 series 2 ; sagittal image 101 series 4). There is an adjacent surgical clip. Small volume of perisplenic fluid and gas. Inflammatory stranding is present about the fluid collections in the left upper quadrant.  Pelvis: Unremarkable bladder, prostate gland and seminal vesicles. No free fluid or suspicious adenopathy.  Bones/Soft Tissues: No acute fracture or aggressive appearing lytic or blastic osseous lesion.  Vascular: The splenic vein is not well seen and is likely thrombosed. There are developing perigastric collaterals. No evidence of arterial pseudoaneurysm or occlusion.  IMPRESSION: 1. Peripherally enhancing fluid and gas collection in the pancreatic tail resection bed and left subdiaphragmatic space which appears contiguous with the lumen of the splenic flexure of the colon. Imaging findings are concerning for abscess eroding into the adjacent colon versus contained colonic leak/perforation. 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 3. Trace left pleural effusion and associated lower lobe atelectasis. 4. Additional ancillary findings as above without significant interval change.   Electronically Signed   By: Malachy MoanHeath  McCullough M.D.   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 Persistent fever and chills, with demonstration of extraluminal gas and fluid collection in the resection bed and subdiaphragmatic space.  EXAM: CT GUIDED PERITONEAL DRAIN CATHETER PLACEMENT  ANESTHESIA/SEDATION: Intravenous Fentanyl and Versed were administered as conscious sedation during continuous cardiorespiratory monitoring by the radiology RN, with a total moderate sedation time of 22 minutes.  PROCEDURE: The procedure risks, benefits, and alternatives were explained to the patient. Questions regarding the procedure were encouraged and answered. The patient understands and consents to the procedure.  Select axial scans through the upper abdomen were obtained. The collection was localized and an appropriate skin entry site was determined. Site was marked.  The operative site Was prepped with Betadinein a sterile fashion, and a sterile drape was applied covering the operative field. A sterile gown and sterile gloves were used for the procedure. Local anesthesia was provided with 1% Lidocaine.  Under angled CT fluoroscopy, an 18 gauge needle was advanced into the collection. Purulent material could be aspirated. An Amplatz guidewire advanced easily within the collection, its position confirmed on CT fluoro. Tract dilated to facilitate placement of a 12 French pigtail catheter, formed within the central aspect of the collection. Drain catheter position confirmed on CT. The catheter was secured externally with 0 Prolene suture and StatLock, and placed to gravity bag. A sample of the aspirate was sent for Gram stain, culture and sensitivity, and amylase.  Complications: None immediate  FINDINGS: The limited upper abdominal CT confirms persistent multiloculated gas and fluid collection in the left subdiaphragmatic space extending anterolateral to the splenic flexure of the colon. The dominant peripheral component was accessed from a subcostal approach  allowing 12 French pigtail drain catheter placement. Approximately 10 mL of thin tan red fluid were aspirated, sent for the requested studies.  IMPRESSION: 1. Technically successful left upper quadrant peritoneal drain catheter placement under CT guidance.   Electronically Signed   By: Oley Balmaniel  Hassell M.D.   On: 01/08/2014 14:43    Labs:  CBC:  Recent Labs  11/22/13 1256 01/07/14 1434 01/08/14 0521 01/10/14 0531  WBC 12.2* 23.8* 17.4* 14.6*  HGB 13.4 14.4 12.2* 11.7*  HCT 39.6 43.5 37.9* 36.1*  PLT 469* 320 260 231    COAGS:  Recent Labs  11/05/13 0940 11/22/13 1256 01/08/14 0521  INR 1.02 1.18 1.32  APTT  --   --  39*    BMP:  Recent Labs  11/18/13 1910 11/22/13 1256 01/07/14 1434 01/10/14 0531  NA 135* 139 138 129*  K 3.8 4.0 4.7 4.0  CL 95* 100 96 92*  CO2 25 22 27 27   GLUCOSE 116* 111* 123* 144*  BUN 7 6 8  4*  CALCIUM 8.8 9.2 9.4 8.7  CREATININE 0.86 0.94 0.94 0.86  GFRNONAA >90 >90 >90 >90  GFRAA >90 >90 >90 >90    LIVER FUNCTION TESTS:  Recent Labs  11/05/13 0940 11/12/13 0449 11/18/13 1910 01/07/14 1434  BILITOT 0.4 1.0 0.6 1.3*  AST 27 19 41* 26  ALT 32 24 76* 42  ALKPHOS 75 58 92 161*  PROT 8.1 6.5 7.1 7.7  ALBUMIN 4.2 2.8* 3.1* 3.2*    Assessment and Plan:  L abd abscess drain intact Will follow    I spent a total of 15 minutes face to face in clinical consultation/evaluation, greater than 50% of which was counseling/coordinating care for abd abscess drain  Signed: Jeb Schloemer A 01/10/2014, 1:21 PM

## 2014-01-11 LAB — CULTURE, ROUTINE-ABSCESS
Culture: NO GROWTH
Special Requests: NORMAL

## 2014-01-11 NOTE — Progress Notes (Signed)
Patient ID: Tyler Smith, male   DOB: 04/14/1960, 53 y.o.   MRN: [id number] Abscess of abdominal cavity  Subjective: Pt has defervesced significantly since yesterday.  Still having some nausea.    Objective: Vital signs in last 24 hours: Temp:  [98.4 F (36.9 C)-99.7 F (37.6 C)] 99.3 F (37.4 C) (12/01 1400) Pulse Rate:  [88-97] 96 (12/01 1400) Resp:  [18-20] 20 (12/01 1400) BP: (109-117)/(68-73) 111/70 mmHg (12/01 1400) SpO2:  [94 %-96 %] 96 % (12/01 1400) Last BM Date: 01/10/14  Intake/Output from previous day: 11/30 0701 - 12/01 0700 In: 1805 [P.O.:600; I.V.:1200] Out: 2174 [Urine:2125; Drains:49] Intake/Output this shift: Total I/O In: 240 [P.O.:240] Out: 962 [Urine:950; Drains:12]  General appearance: alert and cooperative GI: normal findings: soft, non-tender Incision/Wound: healing well Drain with scant purulent fluid  Lab Results:  No results found for this or any previous visit (from the past 24 hour(s)).   Studies/Results Radiology     MEDS, Scheduled . enoxaparin (LOVENOX) injection  40 mg Subcutaneous Q24H  . levofloxacin (LEVAQUIN) IV  750 mg Intravenous Q24H  . levothyroxine  112 mcg Oral QAC breakfast  . metronidazole  500 mg Intravenous Q8H  . pantoprazole  40 mg Oral Daily     Assessment: Abscess of abdominal cavity LUQ abscess  Plan: Cont drain Cont IV Levaquin and Flagyl.   Recheck labs in AM Advance to reg diet  Plan repeat CT tomorrow, and d/c tomorrow if labs/clinical situation OK.     LOS: 4 days   01/11/2014 6:15 PM

## 2014-01-11 NOTE — Progress Notes (Signed)
Patient ID: Tyler Smith, male   DOB: 06/01/1960, 53 y.o.   MRN: [id number]   Referring Physician(s): CCS  Subjective:  Pt doing ok; still a little sore in LUQ at drain site, made worse with movement; occ nausea, no vomiting; tolerating diet ok  Allergies: Penicillins  Medications: Prior to Admission medications   Medication Sig Start Date End Date Taking? Authorizing Provider  acetaminophen (TYLENOL) 325 MG tablet Take 650 mg by mouth daily as needed for moderate pain.    Yes Historical Provider, MD  aspirin EC 81 MG tablet Take 81 mg by mouth at bedtime.    Yes Historical Provider, MD  HYDROcodone-acetaminophen (NORCO/VICODIN) 5-325 MG per tablet Take 1-2 tablets by mouth every 4 (four) hours as needed for moderate pain or severe pain. 11/15/13  Yes Karie SodaSteven Gross, MD  ibuprofen (ADVIL,MOTRIN) 600 MG tablet Take 1 tablet (600 mg total) by mouth every 6 (six) hours as needed for fever, headache, mild pain or moderate pain. 11/15/13  Yes Karie SodaSteven Gross, MD  levothyroxine (SYNTHROID, LEVOTHROID) 112 MCG tablet Take 112 mcg by mouth daily before breakfast.   Yes Historical Provider, MD  pantoprazole (PROTONIX) 40 MG tablet Take 40 mg by mouth daily.  12/27/13  Yes Historical Provider, MD  pravastatin (PRAVACHOL) 40 MG tablet Take 40 mg by mouth at bedtime.    Yes Historical Provider, MD  azithromycin (ZITHROMAX) 250 MG tablet  12/28/13   Historical Provider, MD  levofloxacin (LEVAQUIN) 750 MG tablet Take 750 mg by mouth daily.    Historical Provider, MD  metroNIDAZOLE (FLAGYL) 500 MG tablet Take 500 mg by mouth 3 (three) times daily.    Historical Provider, MD  predniSONE (DELTASONE) 20 MG tablet  12/27/13   Historical Provider, MD    Review of Systems see above  Vital Signs: BP 111/70 mmHg  Pulse 96  Temp(Src) 99.3 F (37.4 C) (Oral)  Resp 20  Ht 5\' 11"  (1.803 m)  Wt 200 lb 6.4 oz (90.901 kg)  BMI 27.96 kg/m2  SpO2 96%  Physical Exam pt awake/alert; LUQ drain intact, output  decreasing, cx's neg; drain irrigated with 5 cc's sterile NS with minimal return- turbid, light yellow fluid. abd - sl dist  Imaging: Dg Chest 2 View  01/07/2014   CLINICAL DATA:  Cough, fever  EXAM: CHEST  2 VIEW  COMPARISON:  CT chest dated 11/18/2013  FINDINGS: Low lung volumes. Patchy bibasilar opacities, left greater than right, likely atelectasis. Left lower lobe pneumonia is not entirely excluded. No pleural effusion or pneumothorax.  The heart is normal in size.  Visualized osseous structures are within normal limits.  IMPRESSION: Low lung volumes with patchy bibasilar opacities, likely atelectasis.  Left lower lobe pneumonia not entirely excluded.   Electronically Signed   By: Charline BillsSriyesh  Krishnan M.D.   On: 01/07/2014 18:26   Ct Abdomen Pelvis W Contrast  01/07/2014   CLINICAL DATA:  53 year old male with persistent fever and chills. History of partial pancreatic resection. Recently finished antibiotic therapy on 01/01/2014.  EXAM: CT ABDOMEN AND PELVIS WITH CONTRAST  TECHNIQUE: Multidetector CT imaging of the abdomen and pelvis was performed using the standard protocol following bolus administration of intravenous contrast.  CONTRAST:  100mL OMNIPAQUE IOHEXOL 300 MG/ML  SOLN  COMPARISON:  Most recent prior CT scan of the abdomen and pelvis 11/22/2013 ; MRI of the abdomen 08/07/2013  FINDINGS: Lower Chest: Mild dependent atelectasis in the left lower lobe. Trace left pleural effusion. Visualized cardiac structures are within normal limits for  size. No pericardial effusion. Unremarkable distal thoracic esophagus.  Abdomen: Unremarkable CT appearance of the stomach, duodenum, spleen, and adrenal glands. Normal hepatic contour and morphology. Hypoattenuating lesion in the posterior aspect of hepatic segment 6 consistent with hemangioma as identified on the prior MRI. Gallbladder is unremarkable. No intra or extrahepatic biliary ductal dilatation. Unremarkable appearance of the bilateral kidneys. No  focal solid lesion, hydronephrosis or nephrolithiasis. No focal bowel wall thickening or evidence of obstruction. Normal appendix in the right lower quadrant.  A mortise peripherally enhancing fluid and gas collection in the left subdiaphragmatic space extending into the pancreatic tail resection bed. The fluid collection is difficult to measure given its a amorphous shape. The pancreatic bed fluid collection measures approximately 5.7 x 3.1 cm. The larger collection in the left subdiaphragmatic space measures approximately 9.3 x 4.4 cm. Of note, the splenic flexure of the colon passes directly between the 2 fluid collections and the lumen of the splenic flexure appears to be contiguous with the fluid and gas collection in the pancreatic resection bed (axial image 27 series 2 ; sagittal image 101 series 4). There is an adjacent surgical clip. Small volume of perisplenic fluid and gas. Inflammatory stranding is present about the fluid collections in the left upper quadrant.  Pelvis: Unremarkable bladder, prostate gland and seminal vesicles. No free fluid or suspicious adenopathy.  Bones/Soft Tissues: No acute fracture or aggressive appearing lytic or blastic osseous lesion.  Vascular: The splenic vein is not well seen and is likely thrombosed. There are developing perigastric collaterals. No evidence of arterial pseudoaneurysm or occlusion.  IMPRESSION: 1. Peripherally enhancing fluid and gas collection in the pancreatic tail resection bed and left subdiaphragmatic space which appears contiguous with the lumen of the splenic flexure of the colon. Imaging findings are concerning for abscess eroding into the adjacent colon versus contained colonic leak/perforation. 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 3. Trace left pleural effusion and associated lower lobe atelectasis. 4. Additional ancillary findings as above without significant interval change.   Electronically Signed   By:  Malachy Moan M.D.   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 Persistent fever and chills, with demonstration of extraluminal gas and fluid collection in the resection bed and subdiaphragmatic space.  EXAM: CT GUIDED PERITONEAL DRAIN CATHETER PLACEMENT  ANESTHESIA/SEDATION: Intravenous Fentanyl and Versed were administered as conscious sedation during continuous cardiorespiratory monitoring by the radiology RN, with a total moderate sedation time of 22 minutes.  PROCEDURE: The procedure risks, benefits, and alternatives were explained to the patient. Questions regarding the procedure were encouraged and answered. The patient understands and consents to the procedure.  Select axial scans through the upper abdomen were obtained. The collection was localized and an appropriate skin entry site was determined. Site was marked.  The operative site Was prepped with Betadinein a sterile fashion, and a sterile drape was applied covering the operative field. A sterile gown and sterile gloves were used for the procedure. Local anesthesia was provided with 1% Lidocaine.  Under angled CT fluoroscopy, an 18 gauge needle was advanced into the collection. Purulent material could be aspirated. An Amplatz guidewire advanced easily within the collection, its position confirmed on CT fluoro. Tract dilated to facilitate placement of a 12 French pigtail catheter, formed within the central aspect of the collection. Drain catheter position confirmed on CT. The catheter  was secured externally with 0 Prolene suture and StatLock, and placed to gravity bag. A sample of the aspirate was sent for Gram stain, culture and sensitivity, and amylase.  Complications: None immediate  FINDINGS: The limited upper abdominal CT confirms persistent multiloculated gas and fluid collection in the left  subdiaphragmatic space extending anterolateral to the splenic flexure of the colon. The dominant peripheral component was accessed from a subcostal approach allowing 12 French pigtail drain catheter placement. Approximately 10 mL of thin tan red fluid were aspirated, sent for the requested studies.  IMPRESSION: 1. Technically successful left upper quadrant peritoneal drain catheter placement under CT guidance.   Electronically Signed   By: Oley Balmaniel  Hassell M.D.   On: 01/08/2014 14:43    Labs:  CBC:  Recent Labs  11/22/13 1256 01/07/14 1434 01/08/14 0521 01/10/14 0531  WBC 12.2* 23.8* 17.4* 14.6*  HGB 13.4 14.4 12.2* 11.7*  HCT 39.6 43.5 37.9* 36.1*  PLT 469* 320 260 231    COAGS:  Recent Labs  11/05/13 0940 11/22/13 1256 01/08/14 0521  INR 1.02 1.18 1.32  APTT  --   --  39*    BMP:  Recent Labs  11/18/13 1910 11/22/13 1256 01/07/14 1434 01/10/14 0531  NA 135* 139 138 129*  K 3.8 4.0 4.7 4.0  CL 95* 100 96 92*  CO2 25 22 27 27   GLUCOSE 116* 111* 123* 144*  BUN 7 6 8  4*  CALCIUM 8.8 9.2 9.4 8.7  CREATININE 0.86 0.94 0.94 0.86  GFRNONAA >90 >90 >90 >90  GFRAA >90 >90 >90 >90    LIVER FUNCTION TESTS:  Recent Labs  11/05/13 0940 11/12/13 0449 11/18/13 1910 01/07/14 1434  BILITOT 0.4 1.0 0.6 1.3*  AST 27 19 41* 26  ALT 32 24 76* 42  ALKPHOS 75 58 92 161*  PROT 8.1 6.5 7.1 7.7  ALBUMIN 4.2 2.8* 3.1* 3.2*    Assessment and Plan: S/p LUQ fluid collection drainage 11/28; fluid cx's neg/amylase 25; occ temp elevation noted; last WBC 14.6; output decreasing (5 cc's reported today, 50 cc's yesterday); rec f/u CT 12/2 to assess adequacy of drainage      I spent a total of 15 minutes face to face in clinical consultation/evaluation, greater than 50% of which was counseling/coordinating care for LUQ fluid collection drainage  Signed: Chinita PesterALLRED,D KEVIN 01/11/2014, 2:11 PM

## 2014-01-11 NOTE — Plan of Care (Signed)
Problem: Phase I Progression Outcomes Goal: OOB as tolerated unless otherwise ordered Outcome: Completed/Met Date Met:  01/11/14  Problem: Phase II Progression Outcomes Goal: Progressing with IS, TCDB Outcome: Progressing

## 2014-01-12 ENCOUNTER — Inpatient Hospital Stay (HOSPITAL_COMMUNITY): Payer: No Typology Code available for payment source

## 2014-01-12 LAB — CBC
HCT: 35.5 % — ABNORMAL LOW (ref 39.0–52.0)
Hemoglobin: 11.3 g/dL — ABNORMAL LOW (ref 13.0–17.0)
MCH: 25.6 pg — ABNORMAL LOW (ref 26.0–34.0)
MCHC: 31.8 g/dL (ref 30.0–36.0)
MCV: 80.5 fL (ref 78.0–100.0)
Platelets: 247 10*3/uL (ref 150–400)
RBC: 4.41 MIL/uL (ref 4.22–5.81)
RDW: 14.1 % (ref 11.5–15.5)
WBC: 15.6 10*3/uL — AB (ref 4.0–10.5)

## 2014-01-12 LAB — BASIC METABOLIC PANEL
ANION GAP: 10 (ref 5–15)
BUN: 4 mg/dL — ABNORMAL LOW (ref 6–23)
CO2: 28 mEq/L (ref 19–32)
Calcium: 8.9 mg/dL (ref 8.4–10.5)
Chloride: 97 mEq/L (ref 96–112)
Creatinine, Ser: 0.88 mg/dL (ref 0.50–1.35)
Glucose, Bld: 125 mg/dL — ABNORMAL HIGH (ref 70–99)
Potassium: 3.9 mEq/L (ref 3.7–5.3)
Sodium: 135 mEq/L — ABNORMAL LOW (ref 137–147)

## 2014-01-12 MED ORDER — SODIUM CHLORIDE 0.9 % IV SOLN
1.0000 g | INTRAVENOUS | Status: DC
  Administered 2014-01-12 – 2014-01-14 (×3): 1 g via INTRAVENOUS
  Filled 2014-01-12 (×3): qty 1

## 2014-01-12 MED ORDER — IOHEXOL 300 MG/ML  SOLN
50.0000 mL | Freq: Once | INTRAMUSCULAR | Status: AC | PRN
  Administered 2014-01-12: 50 mL via ORAL

## 2014-01-12 MED ORDER — IOHEXOL 300 MG/ML  SOLN
100.0000 mL | Freq: Once | INTRAMUSCULAR | Status: AC | PRN
  Administered 2014-01-12: 100 mL via INTRAVENOUS

## 2014-01-12 NOTE — Plan of Care (Signed)
Problem: Phase II Progression Outcomes Goal: Progress activity as tolerated unless otherwise ordered Outcome: Completed/Met Date Met:  01/12/14 Goal: Progressing with IS, TCDB Outcome: Completed/Met Date Met:  01/12/14

## 2014-01-12 NOTE — Progress Notes (Signed)
Patient ID: Tyler PesterJamie L Smith, male   DOB: 03/01/1960, 53 y.o.   MRN: [id number] Abscess of abdominal cavity  Subjective: Pt has defervesced significantly since yesterday.  Still having some nausea.    Objective: Vital signs in last 24 hours: Temp:  [98.6 F (37 C)-102.1 F (38.9 C)] 98.6 F (37 C) (12/02 40980611) Pulse Rate:  [85-96] 85 (12/02 0611) Resp:  [18-20] 18 (12/02 0611) BP: (111-118)/(68-76) 118/68 mmHg (12/02 0611) SpO2:  [94 %-96 %] 94 % (12/02 0611) Last BM Date: 01/10/14  Intake/Output from previous day: 12/01 0701 - 12/02 0700 In: 2405 [P.O.:1200; I.V.:1200] Out: 2721 [Urine:2700; Drains:21] Intake/Output this shift:    General appearance: alert and cooperative GI: normal findings: soft, non-tender Incision/Wound: healing well Drain with scant purulent fluid  Lab Results:  Results for orders placed or performed during the hospital encounter of 01/07/14 (from the past 24 hour(s))  CBC     Status: Abnormal   Collection Time: 01/12/14  4:57 AM  Result Value Ref Range   WBC 15.6 (H) 4.0 - 10.5 K/uL   RBC 4.41 4.22 - 5.81 MIL/uL   Hemoglobin 11.3 (L) 13.0 - 17.0 g/dL   HCT 11.935.5 (L) 14.739.0 - 82.952.0 %   MCV 80.5 78.0 - 100.0 fL   MCH 25.6 (L) 26.0 - 34.0 pg   MCHC 31.8 30.0 - 36.0 g/dL   RDW 56.214.1 13.011.5 - 86.515.5 %   Platelets 247 150 - 400 K/uL  Basic metabolic panel     Status: Abnormal   Collection Time: 01/12/14  4:57 AM  Result Value Ref Range   Sodium 135 (L) 137 - 147 mEq/L   Potassium 3.9 3.7 - 5.3 mEq/L   Chloride 97 96 - 112 mEq/L   CO2 28 19 - 32 mEq/L   Glucose, Bld 125 (H) 70 - 99 mg/dL   BUN 4 (L) 6 - 23 mg/dL   Creatinine, Ser 7.840.88 0.50 - 1.35 mg/dL   Calcium 8.9 8.4 - 69.610.5 mg/dL   GFR calc non Af Amer >90 >90 mL/min   GFR calc Af Amer >90 >90 mL/min   Anion gap 10 5 - 15     Studies/Results Radiology     MEDS, Scheduled . enoxaparin (LOVENOX) injection  40 mg Subcutaneous Q24H  . levofloxacin (LEVAQUIN) IV  750 mg Intravenous Q24H  .  levothyroxine  112 mcg Oral QAC breakfast  . metronidazole  500 mg Intravenous Q8H  . pantoprazole  40 mg Oral Daily     Assessment: Abscess of abdominal cavity LUQ abscess  Plan: Cont drain  Repeat CT.  May need upsize of drain. Given continued fevers, may need additional IV antibiotics Change to ertapenem.      LOS: 5 days   01/12/2014 7:06 AM

## 2014-01-12 NOTE — Plan of Care (Signed)
Problem: Phase II Progression Outcomes Goal: Vital signs stable Outcome: Completed/Met Date Met:  01/12/14  Problem: Phase III Progression Outcomes Goal: Pain controlled on oral analgesia Outcome: Completed/Met Date Met:  01/12/14 Goal: Activity at appropriate level-compared to baseline (UP IN CHAIR FOR HEMODIALYSIS)  Outcome: Completed/Met Date Met:  01/12/14 Goal: Voiding independently Outcome: Completed/Met Date Met:  01/12/14 Goal: Nasogastric tube discontinued Outcome: Not Applicable Date Met:  79/49/97

## 2014-01-13 ENCOUNTER — Encounter (HOSPITAL_COMMUNITY): Payer: Self-pay | Admitting: Radiology

## 2014-01-13 ENCOUNTER — Inpatient Hospital Stay (HOSPITAL_COMMUNITY): Payer: No Typology Code available for payment source

## 2014-01-13 LAB — CBC WITH DIFFERENTIAL/PLATELET
BASOS ABS: 0 10*3/uL (ref 0.0–0.1)
Basophils Relative: 0 % (ref 0–1)
EOS ABS: 0.1 10*3/uL (ref 0.0–0.7)
Eosinophils Relative: 0 % (ref 0–5)
HCT: 35.3 % — ABNORMAL LOW (ref 39.0–52.0)
Hemoglobin: 11.7 g/dL — ABNORMAL LOW (ref 13.0–17.0)
Lymphocytes Relative: 8 % — ABNORMAL LOW (ref 12–46)
Lymphs Abs: 1.5 10*3/uL (ref 0.7–4.0)
MCH: 25.9 pg — AB (ref 26.0–34.0)
MCHC: 33.1 g/dL (ref 30.0–36.0)
MCV: 78.3 fL (ref 78.0–100.0)
Monocytes Absolute: 1.5 10*3/uL — ABNORMAL HIGH (ref 0.1–1.0)
Monocytes Relative: 8 % (ref 3–12)
NEUTROS ABS: 15.7 10*3/uL — AB (ref 1.7–7.7)
Neutrophils Relative %: 84 % — ABNORMAL HIGH (ref 43–77)
PLATELETS: 315 10*3/uL (ref 150–400)
RBC: 4.51 MIL/uL (ref 4.22–5.81)
RDW: 14.3 % (ref 11.5–15.5)
WBC: 18.7 10*3/uL — ABNORMAL HIGH (ref 4.0–10.5)

## 2014-01-13 LAB — CULTURE, BLOOD (ROUTINE X 2)
Culture: NO GROWTH
Culture: NO GROWTH

## 2014-01-13 MED ORDER — FENTANYL CITRATE 0.05 MG/ML IJ SOLN
INTRAMUSCULAR | Status: AC
  Filled 2014-01-13: qty 4

## 2014-01-13 MED ORDER — IBUPROFEN 200 MG PO TABS
600.0000 mg | ORAL_TABLET | Freq: Four times a day (QID) | ORAL | Status: DC | PRN
  Administered 2014-01-13: 600 mg via ORAL
  Filled 2014-01-13: qty 3

## 2014-01-13 MED ORDER — MIDAZOLAM HCL 2 MG/2ML IJ SOLN
INTRAMUSCULAR | Status: AC
  Filled 2014-01-13: qty 4

## 2014-01-13 MED ORDER — FENTANYL CITRATE 0.05 MG/ML IJ SOLN
INTRAMUSCULAR | Status: AC | PRN
  Administered 2014-01-13 (×2): 100 ug via INTRAVENOUS

## 2014-01-13 MED ORDER — OXYCODONE HCL 5 MG PO TABS
5.0000 mg | ORAL_TABLET | ORAL | Status: DC | PRN
  Administered 2014-01-14: 5 mg via ORAL
  Filled 2014-01-13: qty 1

## 2014-01-13 NOTE — Progress Notes (Signed)
Patient ID: Tyler PesterJamie L Smith, male   DOB: 07/03/1960, 53 y.o.   MRN: [id number] Abscess of abdominal cavity  Subjective: CT yesterday with no improvement.  Discussed upsize perc drain with IR.  Objective: Vital signs in last 24 hours: Temp:  [98.7 F (37.1 C)-100.9 F (38.3 C)] 99.3 F (37.4 C) (12/03 0547) Pulse Rate:  [85-95] 85 (12/03 0547) Resp:  [18] 18 (12/03 0547) BP: (114-132)/(66-79) 114/66 mmHg (12/03 0547) SpO2:  [95 %-96 %] 96 % (12/03 0547) Last BM Date: 01/11/14  Intake/Output from previous day: 12/02 0701 - 12/03 0700 In: 2100 [P.O.:840; I.V.:1200; IV Piggyback:50] Out: 3750 [Urine:3700; Drains:50] Intake/Output this shift: Total I/O In: 0  Out: 600 [Urine:600]  General appearance: alert and cooperative GI: normal findings: soft, non-tender Incision/Wound: healing well Drain with scant purulent fluid  Lab Results:  No results found for this or any previous visit (from the past 24 hour(s)).   Studies/Results Radiology     MEDS, Scheduled . enoxaparin (LOVENOX) injection  40 mg Subcutaneous Q24H  . ertapenem  1 g Intravenous Q24H  . levothyroxine  112 mcg Oral QAC breakfast  . pantoprazole  40 mg Oral Daily     Assessment: Abscess of abdominal cavity LUQ abscess  Plan: Cont drain  Upsize drain. See if he can do home ertapenem.   Clears only after perc drain.       LOS: 6 days   01/13/2014 7:38 AM

## 2014-01-13 NOTE — Care Management Note (Signed)
    Page 1 of 1   01/13/2014     10:51:01 AM CARE MANAGEMENT NOTE 01/13/2014  Patient:  Tyler Smith,Tyler Smith   Account Number:  [id number]  Date Initiated:  01/10/2014  Documentation initiated by:  Lorenda IshiharaPEELE,Earlisha Sharples  Subjective/Objective Assessment:   53 yo male admitted with LUQ abscess, s/p robotic distal pancreatectomy 2 months ago. PTA lived at home with spouse.     Action/Plan:   Home when stable   Anticipated DC Date:  01/14/2014   Anticipated DC Plan:  HOME W HOME HEALTH SERVICES      DC Planning Services  CM consult      [facility]   Choice offered to / List presented to:  C-1 Patient        HH arranged  HH-1 RN  HH-10 DISEASE MANAGEMENT  IV Antibiotics      HH agency  [facility]   Status of service:  Completed, signed off Medicare Important Message given?   (If response is "NO", the following Medicare IM given date fields will be blank) Date Medicare IM given:   Medicare IM given by:   Date Additional Medicare IM given:   Additional Medicare IM given by:    Discharge Disposition:  HOME W HOME HEALTH SERVICES  Per UR Regulation:  Reviewed for med. necessity/level of care/duration of stay  If discussed at Long Length of Stay Meetings, dates discussed:    Comments:  01-13-14 Lorenda IshiharaSuzanne Starling Christofferson RN CM [id number] Spoke with patient at bedside. Waiting to have drain upsized in Xray to improve drainage. Plan for d/c home with 2 weeks IV abx. [facility] to arrange for f/u and teaching. Will need PICC placed prior to d/c as well.

## 2014-01-13 NOTE — Plan of Care (Signed)
Problem: Phase III Progression Outcomes Goal: Demonstrates TCDB, IS independently Outcome: Completed/Met Date Met:  01/13/14

## 2014-01-13 NOTE — Procedures (Signed)
Left upper quadrant abscess drain was upsized to 16 [place]French drain.  Greater than 40 ml of yellow purulent fluid was removed.  Attached to suction bulb.  Continue flushing drain and follow output.

## 2014-01-13 NOTE — Progress Notes (Signed)
[facility]  Patient Status: NEW PT FOR [facility] THIS ADMISSION   [facility] is providing the following services: HHRN AND HOME INFUSION PHARMACY FOR HOME IV ABX. [facility] FOLLOW MR. [name] WHILE INPATIENT AND SUPPORT DC TO HOME WHEN ORDERED.  [facility] HOME IV COORDINATOR WILL PROVIDE IN HOSPITAL TEACHING WITH PT AND WIFE PRIOR TO DC TO SUPPORT TRANSITION HOME.   If patient discharges after hours, please call [phone].   Tyler Smith 01/13/2014, 6:29 PM

## 2014-01-13 NOTE — Progress Notes (Signed)
Referring Physician(s): CCS  Subjective: Patient c/o pain at drain site.   Allergies: Penicillins  Medications: Prior to Admission medications   Medication Sig Start Date End Date Taking? Authorizing Provider  acetaminophen (TYLENOL) 325 MG tablet Take 650 mg by mouth daily as needed for moderate pain.    Yes Historical Provider, MD  aspirin EC 81 MG tablet Take 81 mg by mouth at bedtime.    Yes Historical Provider, MD  HYDROcodone-acetaminophen (NORCO/VICODIN) 5-325 MG per tablet Take 1-2 tablets by mouth every 4 (four) hours as needed for moderate pain or severe pain. 11/15/13  Yes Karie SodaSteven Gross, MD  ibuprofen (ADVIL,MOTRIN) 600 MG tablet Take 1 tablet (600 mg total) by mouth every 6 (six) hours as needed for fever, headache, mild pain or moderate pain. 11/15/13  Yes Karie SodaSteven Gross, MD  levothyroxine (SYNTHROID, LEVOTHROID) 112 MCG tablet Take 112 mcg by mouth daily before breakfast.   Yes Historical Provider, MD  pantoprazole (PROTONIX) 40 MG tablet Take 40 mg by mouth daily.  12/27/13  Yes Historical Provider, MD  pravastatin (PRAVACHOL) 40 MG tablet Take 40 mg by mouth at bedtime.    Yes Historical Provider, MD  azithromycin (ZITHROMAX) 250 MG tablet  12/28/13   Historical Provider, MD  levofloxacin (LEVAQUIN) 750 MG tablet Take 750 mg by mouth daily.    Historical Provider, MD  metroNIDAZOLE (FLAGYL) 500 MG tablet Take 500 mg by mouth 3 (three) times daily.    Historical Provider, MD  predniSONE (DELTASONE) 20 MG tablet  12/27/13   Historical Provider, MD    Review of Systems  Vital Signs: BP 114/66 mmHg  Pulse 85  Temp(Src) 99.3 F (37.4 C) (Oral)  Resp 18  Ht 5\' 11"  (1.803 m)  Wt 200 lb 6.4 oz (90.901 kg)  BMI 27.96 kg/m2  SpO2 96%  Physical Exam General: A&Ox3, NAD Abd: Soft, tender at site, LUQ drain intact, white/tan purulent output 50 cc recorded last 24 hrs, < 5cc in bag now.   Imaging: Ct Abdomen Pelvis W Contrast  01/12/2014   CLINICAL DATA:  Followup  abdominal abscess. Previous surgery for cystic pancreatic mass.  EXAM: CT ABDOMEN AND PELVIS WITH CONTRAST  TECHNIQUE: Multidetector CT imaging of the abdomen and pelvis was performed using the standard protocol following bolus administration of intravenous contrast.  CONTRAST:  100mL OMNIPAQUE IOHEXOL 300 MG/ML  SOLN  COMPARISON:  01/07/2014  FINDINGS: Lower Chest: Increased small left pleural effusion and bibasilar atelectasis noted.  Hepatobiliary: Small hemangioma in the post right hepatic lobe appears stable. No other liver masses identified. The gallbladder sludge noted, without evidence of cholecystitis.  Pancreas: Postop changes from distal pancreatectomy again demonstrated. A percutaneous drainage catheter is now seen in the left anterior subphrenic space. A complex fluid and gas collection in the pancreatectomy bed and subphrenic space is difficult to measure but shows no significant change compared to previous study. There is persistent gas within this complex collection this appears to communicate with the splenic flexure the colon on image 25 of series 2. Increased pericolonic inflammatory change also noted in the left upper quadrant.  Spleen: Stable in appearance. Chronic splenic vein thrombosis again demonstrated.  Adrenal Glands:  No mass identified.  Kidneys/Urinary Tract: No masses identified. No evidence of hydronephrosis.  Stomach/Bowel/Peritoneum: No evidence of wall thickening, mass, or obstruction.  Vascular/Lymphatic: No pathologically enlarged lymph nodes identified. No other significant abnormality identified.  Reproductive:  No mass or other significant abnormality identified.  Other:  None.  Musculoskeletal:  No suspicious  bone lesions identified.  IMPRESSION: Percutaneous drainage catheter seen in appropriate position within the left subphrenic collection. This collection shows no significant change in size and continues to contain gas due to apparent communication/fistula with the  splenic flexure of the colon adjacent to the distal pancreatectomy bed. There is also increased in adjacent pericolonic inflammatory change in the left upper quadrant.  Increased small left pleural effusion and bibasilar atelectasis.  Chronic splenic vein thrombosis.  Stable small right hepatic lobe hemangioma, as demonstrated on previous MRI.  These results were called by telephone at the time of interpretation on 01/12/2014 at 12:30 pm to Dr. Almond LintFAERA BYERLY , who verbally acknowledged these results.   Electronically Signed   By: Myles RosenthalJohn  Stahl M.D.   On: 01/12/2014 12:31    Labs:  CBC:  Recent Labs  01/08/14 0521 01/10/14 0531 01/12/14 0457 01/13/14 0753  WBC 17.4* 14.6* 15.6* 18.7*  HGB 12.2* 11.7* 11.3* 11.7*  HCT 37.9* 36.1* 35.5* 35.3*  PLT 260 231 247 315    COAGS:  Recent Labs  11/05/13 0940 11/22/13 1256 01/08/14 0521  INR 1.02 1.18 1.32  APTT  --   --  39*    BMP:  Recent Labs  11/22/13 1256 01/07/14 1434 01/10/14 0531 01/12/14 0457  NA 139 138 129* 135*  K 4.0 4.7 4.0 3.9  CL 100 96 92* 97  CO2 22 27 27 28   GLUCOSE 111* 123* 144* 125*  BUN 6 8 4* 4*  CALCIUM 9.2 9.4 8.7 8.9  CREATININE 0.94 0.94 0.86 0.88  GFRNONAA >90 >90 >90 >90  GFRAA >90 >90 >90 >90    LIVER FUNCTION TESTS:  Recent Labs  11/05/13 0940 11/12/13 0449 11/18/13 1910 01/07/14 1434  BILITOT 0.4 1.0 0.6 1.3*  AST 27 19 41* 26  ALT 32 24 76* 42  ALKPHOS 75 58 92 161*  PROT 8.1 6.5 7.1 7.7  ALBUMIN 4.2 2.8* 3.1* 3.2*    Assessment and Plan: S/p distal pancreatectomy  LUQ abdominal pain and abscess seen on imaging S/p LUQ perc drain placed in IR 11/28, Cx abundant gram (-) rods, pending, wbc up 18 (15), output variable each day, RN's flushing daily.  Repeat CT 12/2 LUQ abscess with no significant improvement Scheduled today for LUQ perc drain exchange and upsize with sedation Patient has been NPO Risks and Benefits discussed with the patient. All of the patient's questions were  answered, patient is agreeable to proceed. Consent signed and in chart.     SignedBerneta Levins: Shaketha Jeon D 01/13/2014, 12:25 PM

## 2014-01-14 LAB — BASIC METABOLIC PANEL
ANION GAP: 11 (ref 5–15)
BUN: 6 mg/dL (ref 6–23)
CHLORIDE: 96 meq/L (ref 96–112)
CO2: 27 meq/L (ref 19–32)
Calcium: 9.1 mg/dL (ref 8.4–10.5)
Creatinine, Ser: 0.89 mg/dL (ref 0.50–1.35)
GFR calc non Af Amer: >90 mL/min (ref >90)
Glucose, Bld: 119 mg/dL — ABNORMAL HIGH (ref 70–99)
POTASSIUM: 4.3 meq/L (ref 3.7–5.3)
SODIUM: 134 meq/L — AB (ref 137–147)

## 2014-01-14 MED ORDER — SODIUM CHLORIDE 0.9 % IV SOLN: 1.0000 g | INTRAVENOUS | Status: DC

## 2014-01-14 MED ORDER — SODIUM CHLORIDE 0.9 % IJ SOLN: 10.0000 mL | INTRAMUSCULAR | Status: DC | PRN

## 2014-01-14 NOTE — Progress Notes (Signed)
Patient ID: Darcus PesterJamie L Nation, male   DOB: 01/02/1961, 53 y.o.   MRN: [id number]   Referring Physician(s): CCS  Subjective:  Pt feeling a little better; denies N/V; some soreness at LUQ drain site; no BM since 12/2 but passing gas; states he is going home today  Allergies: Penicillins  Medications: Prior to Admission medications   Medication Sig Start Date End Date Taking? Authorizing Provider  acetaminophen (TYLENOL) 325 MG tablet Take 650 mg by mouth daily as needed for moderate pain.    Yes Historical Provider, MD  aspirin EC 81 MG tablet Take 81 mg by mouth at bedtime.    Yes Historical Provider, MD  HYDROcodone-acetaminophen (NORCO/VICODIN) 5-325 MG per tablet Take 1-2 tablets by mouth every 4 (four) hours as needed for moderate pain or severe pain. 11/15/13  Yes Karie SodaSteven Gross, MD  ibuprofen (ADVIL,MOTRIN) 600 MG tablet Take 1 tablet (600 mg total) by mouth every 6 (six) hours as needed for fever, headache, mild pain or moderate pain. 11/15/13  Yes Karie SodaSteven Gross, MD  levothyroxine (SYNTHROID, LEVOTHROID) 112 MCG tablet Take 112 mcg by mouth daily before breakfast.   Yes Historical Provider, MD  pantoprazole (PROTONIX) 40 MG tablet Take 40 mg by mouth daily.  12/27/13  Yes Historical Provider, MD  pravastatin (PRAVACHOL) 40 MG tablet Take 40 mg by mouth at bedtime.    Yes Historical Provider, MD  azithromycin (ZITHROMAX) 250 MG tablet  12/28/13   Historical Provider, MD  ertapenem 1 g in sodium chloride 0.9 % 50 mL Inject 1 g into the vein daily. 01/14/14   Almond LintFaera Byerly, MD  levofloxacin (LEVAQUIN) 750 MG tablet Take 750 mg by mouth daily.    Historical Provider, MD  metroNIDAZOLE (FLAGYL) 500 MG tablet Take 500 mg by mouth 3 (three) times daily.    Historical Provider, MD  predniSONE (DELTASONE) 20 MG tablet  12/27/13   Historical Provider, MD    Review of Systems see above  Vital Signs: BP 111/71 mmHg  Pulse 83  Temp(Src) 98.6 F (37 C) (Oral)  Resp 18  Ht 5\' 11"  (1.803 m)  Wt 200 lb  6.4 oz (90.901 kg)  BMI 27.96 kg/m2  SpO2 96%  Physical Exam LUQ drain intact, insertion site ok, mildly tender, sl dist;  output 90 cc's thick light brown fluid; cx's- neg; drain irrigated with 10 cc's sterile NS with return sig amt thick, light brown fluid  Imaging: Ct Abdomen Pelvis W Contrast  01/12/2014   CLINICAL DATA:  Followup abdominal abscess. Previous surgery for cystic pancreatic mass.  EXAM: CT ABDOMEN AND PELVIS WITH CONTRAST  TECHNIQUE: Multidetector CT imaging of the abdomen and pelvis was performed using the standard protocol following bolus administration of intravenous contrast.  CONTRAST:  100mL OMNIPAQUE IOHEXOL 300 MG/ML  SOLN  COMPARISON:  01/07/2014  FINDINGS: Lower Chest: Increased small left pleural effusion and bibasilar atelectasis noted.  Hepatobiliary: Small hemangioma in the post right hepatic lobe appears stable. No other liver masses identified. The gallbladder sludge noted, without evidence of cholecystitis.  Pancreas: Postop changes from distal pancreatectomy again demonstrated. A percutaneous drainage catheter is now seen in the left anterior subphrenic space. A complex fluid and gas collection in the pancreatectomy bed and subphrenic space is difficult to measure but shows no significant change compared to previous study. There is persistent gas within this complex collection this appears to communicate with the splenic flexure the colon on image 25 of series 2. Increased pericolonic inflammatory change also noted in the left  upper quadrant.  Spleen: Stable in appearance. Chronic splenic vein thrombosis again demonstrated.  Adrenal Glands:  No mass identified.  Kidneys/Urinary Tract: No masses identified. No evidence of hydronephrosis.  Stomach/Bowel/Peritoneum: No evidence of wall thickening, mass, or obstruction.  Vascular/Lymphatic: No pathologically enlarged lymph nodes identified. No other significant abnormality identified.  Reproductive:  No mass or other  significant abnormality identified.  Other:  None.  Musculoskeletal:  No suspicious bone lesions identified.  IMPRESSION: Percutaneous drainage catheter seen in appropriate position within the left subphrenic collection. This collection shows no significant change in size and continues to contain gas due to apparent communication/fistula with the splenic flexure of the colon adjacent to the distal pancreatectomy bed. There is also increased in adjacent pericolonic inflammatory change in the left upper quadrant.  Increased small left pleural effusion and bibasilar atelectasis.  Chronic splenic vein thrombosis.  Stable small right hepatic lobe hemangioma, as demonstrated on previous MRI.  These results were called by telephone at the time of interpretation on 01/12/2014 at 12:30 pm to Dr. Almond Lint , who verbally acknowledged these results.   Electronically Signed   By: Myles Rosenthal M.D.   On: 01/12/2014 12:31   Ir Catheter Tube Change  01/13/2014   CLINICAL DATA:  53 year old with left upper quadrant abscess after partial pancreatectomy. There is residual abscess despite drain placement. Request for a drain exchange.  EXAM: IR CATHETER TUBE CHANGE  Physician: Rachelle Hora. Henn, MD  FLUOROSCOPY TIME:  3 min, 63 mGy.  MEDICATIONS: 200 mcg fentanyl  ANESTHESIA/SEDATION: Patient was monitored by radiology nurse throughout the procedure  PROCEDURE: The procedure was explained to the patient. The risks and benefits of the procedure were discussed and the patient's questions were addressed. Informed consent was obtained from the patient. The patient was placed supine on the interventional table. The existing catheter was prepped and draped in sterile fashion. Maximal barrier sterile technique was utilized including caps, mask, sterile gowns, sterile gloves, sterile drape, hand hygiene and skin antiseptic. The catheter was injected with contrast. Catheter was cut and removed over a Bentson wire. A Kumpe catheter was advanced  into the cavity and additional contrast was injected. The Kumpe catheter was removed and the tract was dilated to accommodate a 16 [place] multipurpose drain. Approximately 40 mL of foul-smelling purulent fluid was aspirated from the collection. The collection was irrigated with saline. Catheter was attached to a suction bulb. Catheter was sutured to the skin.  FINDINGS: Patient has a left upper quadrant abscess. The initial contrast injection demonstrated an irregular collection with multiple pockets. Following removal of the drain over a wire, purulent fluid was draining at the skin site. Following placement of the 64 French drain, large amount of purulent fluid was removed. The abscess collection appeared to be decompressed at the end of the procedure.  COMPLICATIONS: None  IMPRESSION: Successful up-sizing of the left upper quadrant abscess drain. The patient now has a 16 [place] catheter.   Electronically Signed   By: Richarda Overlie M.D.   On: 01/13/2014 15:11    Labs:  CBC:  Recent Labs  01/08/14 0521 01/10/14 0531 01/12/14 0457 01/13/14 0753  WBC 17.4* 14.6* 15.6* 18.7*  HGB 12.2* 11.7* 11.3* 11.7*  HCT 37.9* 36.1* 35.5* 35.3*  PLT 260 231 247 315    COAGS:  Recent Labs  11/05/13 0940 11/22/13 1256 01/08/14 0521  INR 1.02 1.18 1.32  APTT  --   --  39*    BMP:  Recent Labs  01/07/14  1434 01/10/14 0531 01/12/14 0457 01/14/14 0421  NA 138 129* 135* 134*  K 4.7 4.0 3.9 4.3  CL 96 92* 97 96  CO2 27 27 28 27   GLUCOSE 123* 144* 125* 119*  BUN 8 4* 4* 6  CALCIUM 9.4 8.7 8.9 9.1  CREATININE 0.94 0.86 0.88 0.89  GFRNONAA >90 >90 >90 >90  GFRAA >90 >90 >90 >90    LIVER FUNCTION TESTS:  Recent Labs  11/05/13 0940 11/12/13 0449 11/18/13 1910 01/07/14 1434  BILITOT 0.4 1.0 0.6 1.3*  AST 27 19 41* 26  ALT 32 24 76* 42  ALKPHOS 75 58 92 161*  PROT 8.1 6.5 7.1 7.7  ALBUMIN 4.2 2.8* 3.1* 3.2*    Assessment and Plan: S/p LUQ fluid collection drainage 11/28 (post  partial pancreatectomy), drain upsizing 12/3 to 16 french; output has improved with drain upsizing; will need cont diligent irrigation to expedite removal; if pt d/c'd home with drain rec at least once daily irrigation of drain with 10 cc's sterile NS, recording of output and dressing changes every 2-3 days; if CCS desires can arrange OP drain clinic f/u with IR on 12/8 or 12/15 (call [phone] with any questions)      I spent a total of 15 minutes face to face in clinical consultation/evaluation, greater than 50% of which was counseling/coordinating care for LUQ fluid collection drainage  Signed: Chinita PesterALLRED,D KEVIN 01/14/2014, 11:34 AM

## 2014-01-14 NOTE — Progress Notes (Signed)
Peripherally Inserted Central Catheter/Midline Placement  The IV Nurse has discussed with the patient and/or persons authorized to consent for the patient, the purpose of this procedure and the potential benefits and risks involved with this procedure.  The benefits include less needle sticks, lab draws from the catheter and patient may be discharged home with the catheter.  Risks include, but not limited to, infection, bleeding, blood clot (thrombus formation), and puncture of an artery; nerve damage and irregular heat beat.  Alternatives to this procedure were also discussed.  PICC/Midline Placement Documentation        Lisabeth DevoidGibbs, Aryka Coonradt Jeanette 01/14/2014, 2:54 PM

## 2014-01-14 NOTE — Discharge Summary (Signed)
Physician Discharge Summary  Patient ID: Tyler Smith MRN: [id number] DOB/AGE: 51/10/1960 51 y.o.  Admit date: 01/07/2014 Discharge date: 01/14/2014  Admission Diagnoses: Abdominal abscess  Discharge Diagnoses:  Principal Problem:   Abscess of abdominal cavity Active Problems:   Intraabdominal fluid collection   Discharged Condition: stable  Hospital Course:  Patient was admitted from the ER for fevers and abscess in the LUQ.  He previously had a fluid collection in this area and had it aspirated. This did not grow anything.  He had this done as an outpatient and took oral antibiotics previously.  This admission, he got the collection drained, but continued to have high fevers.  The drain was upsized after repeat CT did not show improvement.  Based on a few images, it was postulated that this may be from a very small colonic leak.  There was no contrast leakage from the colon, but the air and fluid was very proximate to the colon wall.  He was attempted to be switched to oral antibiotics, but had fevers.  He was switched to IV ertapenem and tolerated well.  He was discharged with PICC line and daily ertapenem for 2-3 weeks.  I will see him back in 2 weeks.    Consults: Interventional radiology  Significant Diagnostic Studies: radiology: CT scan: see above  Treatments: antibiotics: Cipro, metronidazole and then ertapenem and procedures: percutaneous drainage catheter placement  Discharge Exam: Blood pressure 111/71, pulse 83, temperature 98.6 F (37 C), temperature source Oral, resp. rate 18, height 5\' 11"  (1.803 m), weight 200 lb 6.4 oz (90.901 kg), SpO2 96 %. General appearance: alert, cooperative and no distress Resp: breathing comfortably Cardio: regular rate and rhythm GI: soft, non distended, non tender, drain output murky.  Disposition: 01-Home or Self Care  Discharge Instructions    Call MD for:  difficulty breathing, headache or visual disturbances    Complete by:  As  directed      Call MD for:  persistant nausea and vomiting    Complete by:  As directed      Call MD for:  redness, tenderness, or signs of infection (pain, swelling, redness, odor or green/yellow discharge around incision site)    Complete by:  As directed      Call MD for:  severe uncontrolled pain    Complete by:  As directed      Call MD for:  temperature >100.4    Complete by:  As directed      Change dressing (specify)    Complete by:  As directed   Measure and record drain output 1-2 x/day.  Record output and bring record to clinic.  Infuse 1 g ertapenem q day.     Increase activity slowly    Complete by:  As directed             Medication List    STOP taking these medications        azithromycin 250 MG tablet  Commonly known as:  ZITHROMAX     levofloxacin 750 MG tablet  Commonly known as:  LEVAQUIN     metroNIDAZOLE 500 MG tablet  Commonly known as:  FLAGYL     predniSONE 20 MG tablet  Commonly known as:  DELTASONE      TAKE these medications        acetaminophen 325 MG tablet  Commonly known as:  TYLENOL  Take 650 mg by mouth daily as needed for moderate pain.     aspirin EC  81 MG tablet  Take 81 mg by mouth at bedtime.     ertapenem 1 g in sodium chloride 0.9 % 50 mL  Inject 1 g into the vein daily.     HYDROcodone-acetaminophen 5-325 MG per tablet  Commonly known as:  NORCO/VICODIN  Take 1-2 tablets by mouth every 4 (four) hours as needed for moderate pain or severe pain.     ibuprofen 600 MG tablet  Commonly known as:  ADVIL,MOTRIN  Take 1 tablet (600 mg total) by mouth every 6 (six) hours as needed for fever, headache, mild pain or moderate pain.     levothyroxine 112 MCG tablet  Commonly known as:  SYNTHROID, LEVOTHROID  Take 112 mcg by mouth daily before breakfast.     pantoprazole 40 MG tablet  Commonly known as:  PROTONIX  Take 40 mg by mouth daily.     pravastatin 40 MG tablet  Commonly known as:  PRAVACHOL  Take 40 mg by mouth  at bedtime.           Follow-up Information    Follow up with [facility].   Why:  Nurse for IV antibiotics and drain care   Contact information:   [address] [place] [address] [phone]       [facility] In 2 weeks.   Specialty:  General Surgery   Contact information:   [address] [place] [address] [phone]       Signed: Almond LintBYERLY,Tishara Pizano 01/14/2014, 8:09 AM

## 2014-01-14 NOTE — Discharge Instructions (Signed)
PICC Home Guide A peripherally inserted central catheter (PICC) is a long, thin, flexible tube that is inserted into a vein in the upper arm. It is a form of intravenous (IV) access. It is considered to be a "central" line because the tip of the PICC ends in a large vein in your chest. This large vein is called the superior vena cava (SVC). The PICC tip ends in the SVC because there is a lot of blood flow in the SVC. This allows medicines and IV fluids to be quickly distributed throughout the body. The PICC is inserted using a sterile technique by a specially trained nurse or physician. After the PICC is inserted, a chest X-ray exam is done to be sure it is in the correct place.  A PICC may be placed for different reasons, such as:  To give medicines and liquid nutrition that can only be given through a central line. Examples are:  Certain antibiotic treatments.  Chemotherapy.  Total parenteral nutrition (TPN).  To take frequent blood samples.  To give IV fluids and blood products.  If there is difficulty placing a peripheral intravenous (PIV) catheter. If taken care of properly, a PICC can remain in place for several months. A PICC can also allow a person to go home from the hospital early. Medicine and PICC care can be managed at home by a family member or home health care team. WHAT PROBLEMS CAN HAPPEN WHEN I HAVE A PICC? Problems with a PICC can occasionally occur. These may include the following:  A blood clot (thrombus) forming in or at the tip of the PICC. This can cause the PICC to become clogged. A clot-dissolving medicine called tissue plasminogen activator (tPA) can be given through the PICC to help break up the clot.  Inflammation of the vein (phlebitis) in which the PICC is placed. Signs of inflammation may include redness, pain at the insertion site, red streaks, or being able to feel a "cord" in the vein where the PICC is located.  Infection in the PICC or at the insertion  site. Signs of infection may include fever, chills, redness, swelling, or pus drainage from the PICC insertion site.  PICC movement (malposition). The PICC tip may move from its original position due to excessive physical activity, forceful coughing, sneezing, or vomiting.  A break or cut in the PICC. It is important to not use scissors near the PICC.  Nerve or tendon irritation or injury during PICC insertion. WHAT SHOULD I KEEP IN MIND ABOUT ACTIVITIES WHEN I HAVE A PICC?  You may bend your arm and move it freely. If your PICC is near or at the bend of your elbow, avoid activity with repeated motion at the elbow.  Rest at home for the remainder of the day following PICC line insertion.  Avoid lifting heavy objects as instructed by your health care provider.  Avoid using a crutch with the arm on the same side as your PICC. You may need to use a walker. WHAT SHOULD I KNOW ABOUT MY PICC DRESSING?  Keep your PICC bandage (dressing) clean and dry to prevent infection.  Ask your health care provider when you may shower. Ask your health care provider to teach you how to wrap the PICC when you do take a shower.  Change the PICC dressing as instructed by your health care provider.  Change your PICC dressing if it becomes loose or wet. WHAT SHOULD I KNOW ABOUT PICC CARE?  Check the PICC insertion site   daily for leakage, redness, swelling, or pain.  Do not take a bath, swim, or use hot tubs when you have a PICC. Cover PICC line with clear plastic wrap and tape to keep it dry while showering.  Flush the PICC as directed by your health care provider. Let your health care provider know right away if the PICC is difficult to flush or does not flush. Do not use force to flush the PICC.  Do not use a syringe that is less than 10 mL to flush the PICC.  Never pull or tug on the PICC.  Avoid blood pressure checks on the arm with the PICC.  Keep your PICC identification card with you at all  times.  Do not take the PICC out yourself. Only a trained clinical professional should remove the PICC. SEEK IMMEDIATE MEDICAL CARE IF:  Your PICC is accidentally pulled all the way out. If this happens, cover the insertion site with a bandage or gauze dressing. Do not throw the PICC away. Your health care provider will need to inspect it.  Your PICC was tugged or pulled and has partially come out. Do not  push the PICC back in.  There is any type of drainage, redness, or swelling where the PICC enters the skin.  You cannot flush the PICC, it is difficult to flush, or the PICC leaks around the insertion site when it is flushed.  You hear a "flushing" sound when the PICC is flushed.  You have pain, discomfort, or numbness in your arm, shoulder, or jaw on the same side as the PICC.  You feel your heart "racing" or skipping beats.  You notice a hole or tear in the PICC.  You develop chills or a fever. MAKE SURE YOU:   Understand these instructions.  Will watch your condition.  Will get help right away if you are not doing well or get worse. Document Released: 08/04/2002 Document Revised: 06/14/2013 Document Reviewed: 10/05/2012 [facility] Patient Information 2015 [place], [place]. This information is not intended to replace advice given to you by your health care provider. Make sure you discuss any questions you have with your health care provider. Bulb Drain Home Care A bulb drain consists of a thin rubber tube and a soft, round bulb that creates a gentle suction. The rubber tube is placed in the area where you had surgery. A bulb is attached to the end of the tube that is outside the body. The bulb drain removes excess fluid that normally builds up in a surgical wound after surgery. The color and amount of fluid will vary. Immediately after surgery, the fluid is bright red and is a little thicker than water. It may gradually change to a yellow or pink color and become more thin and  water-like. When the amount decreases to about 1 or 2 tbsp in 24 hours, your health care provider will usually remove it. DAILY CARE  Keep the bulb flat (compressed) at all times, except while emptying it. The flatness creates suction. You can flatten the bulb by squeezing it firmly in the middle and then closing the cap.  Keep sites where the tube enters the skin dry and covered with a bandage (dressing).  Secure the tube 1-2 in (2.5-5.1 cm) below the insertion sites to keep it from pulling on your stitches. The tube is stitched in place and will not slip out.  Secure the bulb as directed by your health care provider.  For the first 3 days after surgery,  there usually is more fluid in the bulb. Empty the bulb whenever it becomes half full because the bulb does not create enough suction if it is too full. The bulb could also overflow. Write down how much fluid you remove each time you empty your drain. Add up the amount removed in 24 hours.  Empty the bulb at the same time every day once the amount of fluid decreases and you only need to empty it once a day. Write down the amounts and the 24-hour totals to give to your health care provider. This helps your health care provider know when the tubes can be removed. EMPTYING THE BULB DRAIN Before emptying the bulb, get a measuring cup, a piece of paper and a pen, and wash your hands.  Gently run your fingers down the tube (stripping) to empty any drainage from the tubing into the bulb. This may need to be done several times a day to clear the tubing of clots and tissue.  Open the bulb cap to release suction, which causes it to inflate. Do not touch the inside of the cap.  Gently run your fingers down the tube (stripping) to empty any drainage from the tubing into the bulb.  Hold the cap out of the way, and pour fluid into the measuring cup.   Squeeze the bulb to provide suction.  Replace the cap.   Check the tape that holds the tube to  your skin. If it is becoming loose, you can remove the loose piece of tape and apply a new one. Then, pin the bulb to your shirt.   Write down the amount of fluid you emptied out. Write down the date and each time you emptied your bulb drain. (If there are 2 bulbs, note the amount of drainage from each bulb and keep the totals separate. Your health care provider will want to know the total amounts for each drain and which tube is draining more.)   Flush the fluid down the toilet and wash your hands.   Call your health care provider once you have less than 2 tbsp of fluid collecting in the bulb drain every 24 hours. If there is drainage around the tube site, change dressings and keep the area dry. Cleanse around tube with sterile saline and place dry gauze around site. This gauze should be changed when it is soiled. If it stays clean and unsoiled, it should still be changed daily.  SEEK MEDICAL CARE IF:  Your drainage has a bad smell or is cloudy.   You have a fever.   Your drainage is increasing instead of decreasing.   Your tube fell out.   You have redness or swelling around the tube site.   You have drainage from a surgical wound.   Your bulb drain will not stay flat after you empty it.  MAKE SURE YOU:   Understand these instructions.  Will watch your condition.  Will get help right away if you are not doing well or get worse. Document Released: 01/26/2000 Document Revised: 06/14/2013 Document Reviewed: 07/03/2011 [facility] Patient Information 2015 [place], [place]. This information is not intended to replace advice given to you by your health care provider. Make sure you discuss any questions you have with your health care provider.

## 2014-01-14 NOTE — Plan of Care (Signed)
Problem: Discharge Progression Outcomes Goal: Barriers To Progression Addressed/Resolved Outcome: Completed/Met Date Met:  01/14/14 Goal: Discharge plan in place and appropriate Outcome: Completed/Met Date Met:  01/14/14 Goal: Pain controlled with appropriate interventions Outcome: Completed/Met Date Met:  01/14/14 Goal: Hemodynamically stable Outcome: Completed/Met Date Met:  01/14/14 Goal: Tubes and drains discontinued if indicated Outcome: Not Met (add Reason) Drain teaching for home completed. Wife aware

## 2014-01-26 ENCOUNTER — Telehealth (INDEPENDENT_AMBULATORY_CARE_PROVIDER_SITE_OTHER): Payer: Self-pay

## 2014-01-26 ENCOUNTER — Other Ambulatory Visit (HOSPITAL_COMMUNITY): Payer: Self-pay | Admitting: Diagnostic Radiology

## 2014-01-26 DIAGNOSIS — L0291 Cutaneous abscess, unspecified: Secondary | ICD-10-CM

## 2014-01-26 NOTE — Telephone Encounter (Signed)
New labs from 12/14 draw are now available. Pt's wife called to request report.  SL They are in [employer].

## 2014-01-28 ENCOUNTER — Ambulatory Visit (HOSPITAL_COMMUNITY)
Admission: RE | Admit: 2014-01-28 | Discharge: 2014-01-28 | Disposition: A | Discharge status: Home / Self Care | Payer: No Typology Code available for payment source | Source: Ambulatory Visit | Attending: Diagnostic Radiology | Admitting: Diagnostic Radiology

## 2014-01-28 ENCOUNTER — Encounter (HOSPITAL_COMMUNITY): Payer: Self-pay

## 2014-01-28 ENCOUNTER — Other Ambulatory Visit (HOSPITAL_COMMUNITY): Payer: Self-pay | Admitting: Diagnostic Radiology

## 2014-01-28 DIAGNOSIS — Z4889 Encounter for other specified surgical aftercare: Secondary | ICD-10-CM | POA: Diagnosis not present

## 2014-01-28 DIAGNOSIS — L0291 Cutaneous abscess, unspecified: Secondary | ICD-10-CM

## 2014-01-28 DIAGNOSIS — Z90411 Acquired partial absence of pancreas: Secondary | ICD-10-CM | POA: Diagnosis present

## 2014-01-28 MED ORDER — IOHEXOL 300 MG/ML  SOLN
100.0000 mL | Freq: Once | INTRAMUSCULAR | Status: AC | PRN
  Administered 2014-01-28: 100 mL via INTRAVENOUS

## 2014-01-28 NOTE — Progress Notes (Signed)
The patient was seen after his abdominal CT.  The left upper quadrant drain is draining yellowish/green fluid.  A large clot of material came out recently.  The CT demonstrates stable position of the left upper quadrant drain and decreased size of the air and fluid around the drain.  There is still concern for a colonic leak or fistula.  Discussed with Dr. Donell BeersByerly.  Will keep drain intact and continue flushes.  Plan to see patient back in drain clinic on 02/08/14 and may perform a drain injection at that time.

## 2014-02-01 ENCOUNTER — Ambulatory Visit (HOSPITAL_COMMUNITY): Payer: No Typology Code available for payment source

## 2014-02-08 ENCOUNTER — Ambulatory Visit (HOSPITAL_COMMUNITY)
Admission: RE | Admit: 2014-02-08 | Discharge: 2014-02-08 | Disposition: A | Discharge status: Home / Self Care | Payer: No Typology Code available for payment source | Source: Ambulatory Visit | Attending: Diagnostic Radiology | Admitting: Diagnostic Radiology

## 2014-02-08 DIAGNOSIS — Z90411 Acquired partial absence of pancreas: Secondary | ICD-10-CM | POA: Insufficient documentation

## 2014-02-08 DIAGNOSIS — K651 Peritoneal abscess: Secondary | ICD-10-CM | POA: Diagnosis present

## 2014-02-08 DIAGNOSIS — K632 Fistula of intestine: Secondary | ICD-10-CM | POA: Diagnosis not present

## 2014-02-08 DIAGNOSIS — L0291 Cutaneous abscess, unspecified: Secondary | ICD-10-CM

## 2014-02-08 MED ORDER — IOHEXOL 300 MG/ML  SOLN
50.0000 mL | Freq: Once | INTRAMUSCULAR | Status: AC | PRN
  Administered 2014-02-08: 10 mL

## 2014-02-16 ENCOUNTER — Other Ambulatory Visit (HOSPITAL_COMMUNITY): Payer: Self-pay | Admitting: Diagnostic Radiology

## 2014-02-16 DIAGNOSIS — L0291 Cutaneous abscess, unspecified: Secondary | ICD-10-CM

## 2014-02-17 ENCOUNTER — Other Ambulatory Visit (INDEPENDENT_AMBULATORY_CARE_PROVIDER_SITE_OTHER): Payer: Self-pay | Admitting: Surgery

## 2014-02-22 ENCOUNTER — Ambulatory Visit (HOSPITAL_COMMUNITY)
Admission: RE | Admit: 2014-02-22 | Discharge: 2014-02-22 | Disposition: A | Discharge status: Home / Self Care | Payer: No Typology Code available for payment source | Source: Ambulatory Visit | Attending: Diagnostic Radiology | Admitting: Diagnostic Radiology

## 2014-02-22 DIAGNOSIS — K632 Fistula of intestine: Secondary | ICD-10-CM | POA: Insufficient documentation

## 2014-02-22 DIAGNOSIS — L0291 Cutaneous abscess, unspecified: Secondary | ICD-10-CM

## 2014-02-22 MED ORDER — IOHEXOL 300 MG/ML  SOLN
50.0000 mL | Freq: Once | INTRAMUSCULAR | Status: AC | PRN
  Administered 2014-02-22: 10 mL

## 2014-03-03 ENCOUNTER — Other Ambulatory Visit (HOSPITAL_COMMUNITY): Payer: Self-pay | Admitting: Interventional Radiology

## 2014-03-03 DIAGNOSIS — L0291 Cutaneous abscess, unspecified: Secondary | ICD-10-CM

## 2014-03-08 ENCOUNTER — Ambulatory Visit (HOSPITAL_COMMUNITY)
Admission: RE | Admit: 2014-03-08 | Discharge: 2014-03-08 | Disposition: A | Discharge status: Home / Self Care | Payer: No Typology Code available for payment source | Source: Ambulatory Visit | Attending: Interventional Radiology | Admitting: Interventional Radiology

## 2014-03-08 ENCOUNTER — Other Ambulatory Visit (HOSPITAL_COMMUNITY): Payer: Self-pay | Admitting: General Surgery

## 2014-03-08 DIAGNOSIS — L0291 Cutaneous abscess, unspecified: Secondary | ICD-10-CM

## 2014-03-08 DIAGNOSIS — Z4803 Encounter for change or removal of drains: Secondary | ICD-10-CM | POA: Diagnosis not present

## 2014-03-08 DIAGNOSIS — Z4889 Encounter for other specified surgical aftercare: Secondary | ICD-10-CM | POA: Diagnosis not present

## 2014-03-08 DIAGNOSIS — K651 Peritoneal abscess: Secondary | ICD-10-CM | POA: Insufficient documentation

## 2014-03-08 MED ORDER — IOHEXOL 300 MG/ML  SOLN
50.0000 mL | Freq: Once | INTRAMUSCULAR | Status: AC | PRN
  Administered 2014-03-08: 3 mL

## 2016-03-07 DIAGNOSIS — M542 Cervicalgia: Secondary | ICD-10-CM | POA: Diagnosis not present

## 2016-03-07 DIAGNOSIS — M9901 Segmental and somatic dysfunction of cervical region: Secondary | ICD-10-CM | POA: Diagnosis not present

## 2016-03-07 DIAGNOSIS — M9902 Segmental and somatic dysfunction of thoracic region: Secondary | ICD-10-CM | POA: Diagnosis not present

## 2016-03-08 DIAGNOSIS — M9902 Segmental and somatic dysfunction of thoracic region: Secondary | ICD-10-CM | POA: Diagnosis not present

## 2016-03-08 DIAGNOSIS — M9901 Segmental and somatic dysfunction of cervical region: Secondary | ICD-10-CM | POA: Diagnosis not present

## 2016-03-08 DIAGNOSIS — M542 Cervicalgia: Secondary | ICD-10-CM | POA: Diagnosis not present

## 2016-03-11 DIAGNOSIS — M792 Neuralgia and neuritis, unspecified: Secondary | ICD-10-CM | POA: Diagnosis not present

## 2016-03-11 DIAGNOSIS — M62838 Other muscle spasm: Secondary | ICD-10-CM | POA: Diagnosis not present

## 2016-03-11 DIAGNOSIS — E039 Hypothyroidism, unspecified: Secondary | ICD-10-CM | POA: Diagnosis not present

## 2016-03-12 DIAGNOSIS — M9902 Segmental and somatic dysfunction of thoracic region: Secondary | ICD-10-CM | POA: Diagnosis not present

## 2016-03-12 DIAGNOSIS — M542 Cervicalgia: Secondary | ICD-10-CM | POA: Diagnosis not present

## 2016-03-12 DIAGNOSIS — M9901 Segmental and somatic dysfunction of cervical region: Secondary | ICD-10-CM | POA: Diagnosis not present

## 2016-03-13 DIAGNOSIS — M9902 Segmental and somatic dysfunction of thoracic region: Secondary | ICD-10-CM | POA: Diagnosis not present

## 2016-03-13 DIAGNOSIS — M542 Cervicalgia: Secondary | ICD-10-CM | POA: Diagnosis not present

## 2016-03-13 DIAGNOSIS — M9901 Segmental and somatic dysfunction of cervical region: Secondary | ICD-10-CM | POA: Diagnosis not present

## 2016-03-14 DIAGNOSIS — M9901 Segmental and somatic dysfunction of cervical region: Secondary | ICD-10-CM | POA: Diagnosis not present

## 2016-03-14 DIAGNOSIS — M542 Cervicalgia: Secondary | ICD-10-CM | POA: Diagnosis not present

## 2016-03-14 DIAGNOSIS — M9902 Segmental and somatic dysfunction of thoracic region: Secondary | ICD-10-CM | POA: Diagnosis not present

## 2016-03-19 DIAGNOSIS — M9901 Segmental and somatic dysfunction of cervical region: Secondary | ICD-10-CM | POA: Diagnosis not present

## 2016-03-19 DIAGNOSIS — M542 Cervicalgia: Secondary | ICD-10-CM | POA: Diagnosis not present

## 2016-03-19 DIAGNOSIS — M9902 Segmental and somatic dysfunction of thoracic region: Secondary | ICD-10-CM | POA: Diagnosis not present

## 2016-03-20 DIAGNOSIS — M9902 Segmental and somatic dysfunction of thoracic region: Secondary | ICD-10-CM | POA: Diagnosis not present

## 2016-03-20 DIAGNOSIS — M9901 Segmental and somatic dysfunction of cervical region: Secondary | ICD-10-CM | POA: Diagnosis not present

## 2016-03-20 DIAGNOSIS — M542 Cervicalgia: Secondary | ICD-10-CM | POA: Diagnosis not present

## 2016-03-25 DIAGNOSIS — M9901 Segmental and somatic dysfunction of cervical region: Secondary | ICD-10-CM | POA: Diagnosis not present

## 2016-03-25 DIAGNOSIS — M542 Cervicalgia: Secondary | ICD-10-CM | POA: Diagnosis not present

## 2016-03-25 DIAGNOSIS — M9902 Segmental and somatic dysfunction of thoracic region: Secondary | ICD-10-CM | POA: Diagnosis not present

## 2016-03-26 DIAGNOSIS — M542 Cervicalgia: Secondary | ICD-10-CM | POA: Diagnosis not present

## 2016-03-26 DIAGNOSIS — M9902 Segmental and somatic dysfunction of thoracic region: Secondary | ICD-10-CM | POA: Diagnosis not present

## 2016-03-26 DIAGNOSIS — M9901 Segmental and somatic dysfunction of cervical region: Secondary | ICD-10-CM | POA: Diagnosis not present

## 2016-03-27 DIAGNOSIS — M542 Cervicalgia: Secondary | ICD-10-CM | POA: Diagnosis not present

## 2016-03-27 DIAGNOSIS — M9902 Segmental and somatic dysfunction of thoracic region: Secondary | ICD-10-CM | POA: Diagnosis not present

## 2016-03-27 DIAGNOSIS — M9901 Segmental and somatic dysfunction of cervical region: Secondary | ICD-10-CM | POA: Diagnosis not present

## 2016-04-04 IMAGING — CR DG CHEST 2V
2 series · 2 of 2 positions shown · non-contrast
Comparison: None.

CLINICAL DATA: Three-month history of nonproductive cough; no
history of tobacco use

EXAM:
CHEST  2 VIEW

[w chest pa]
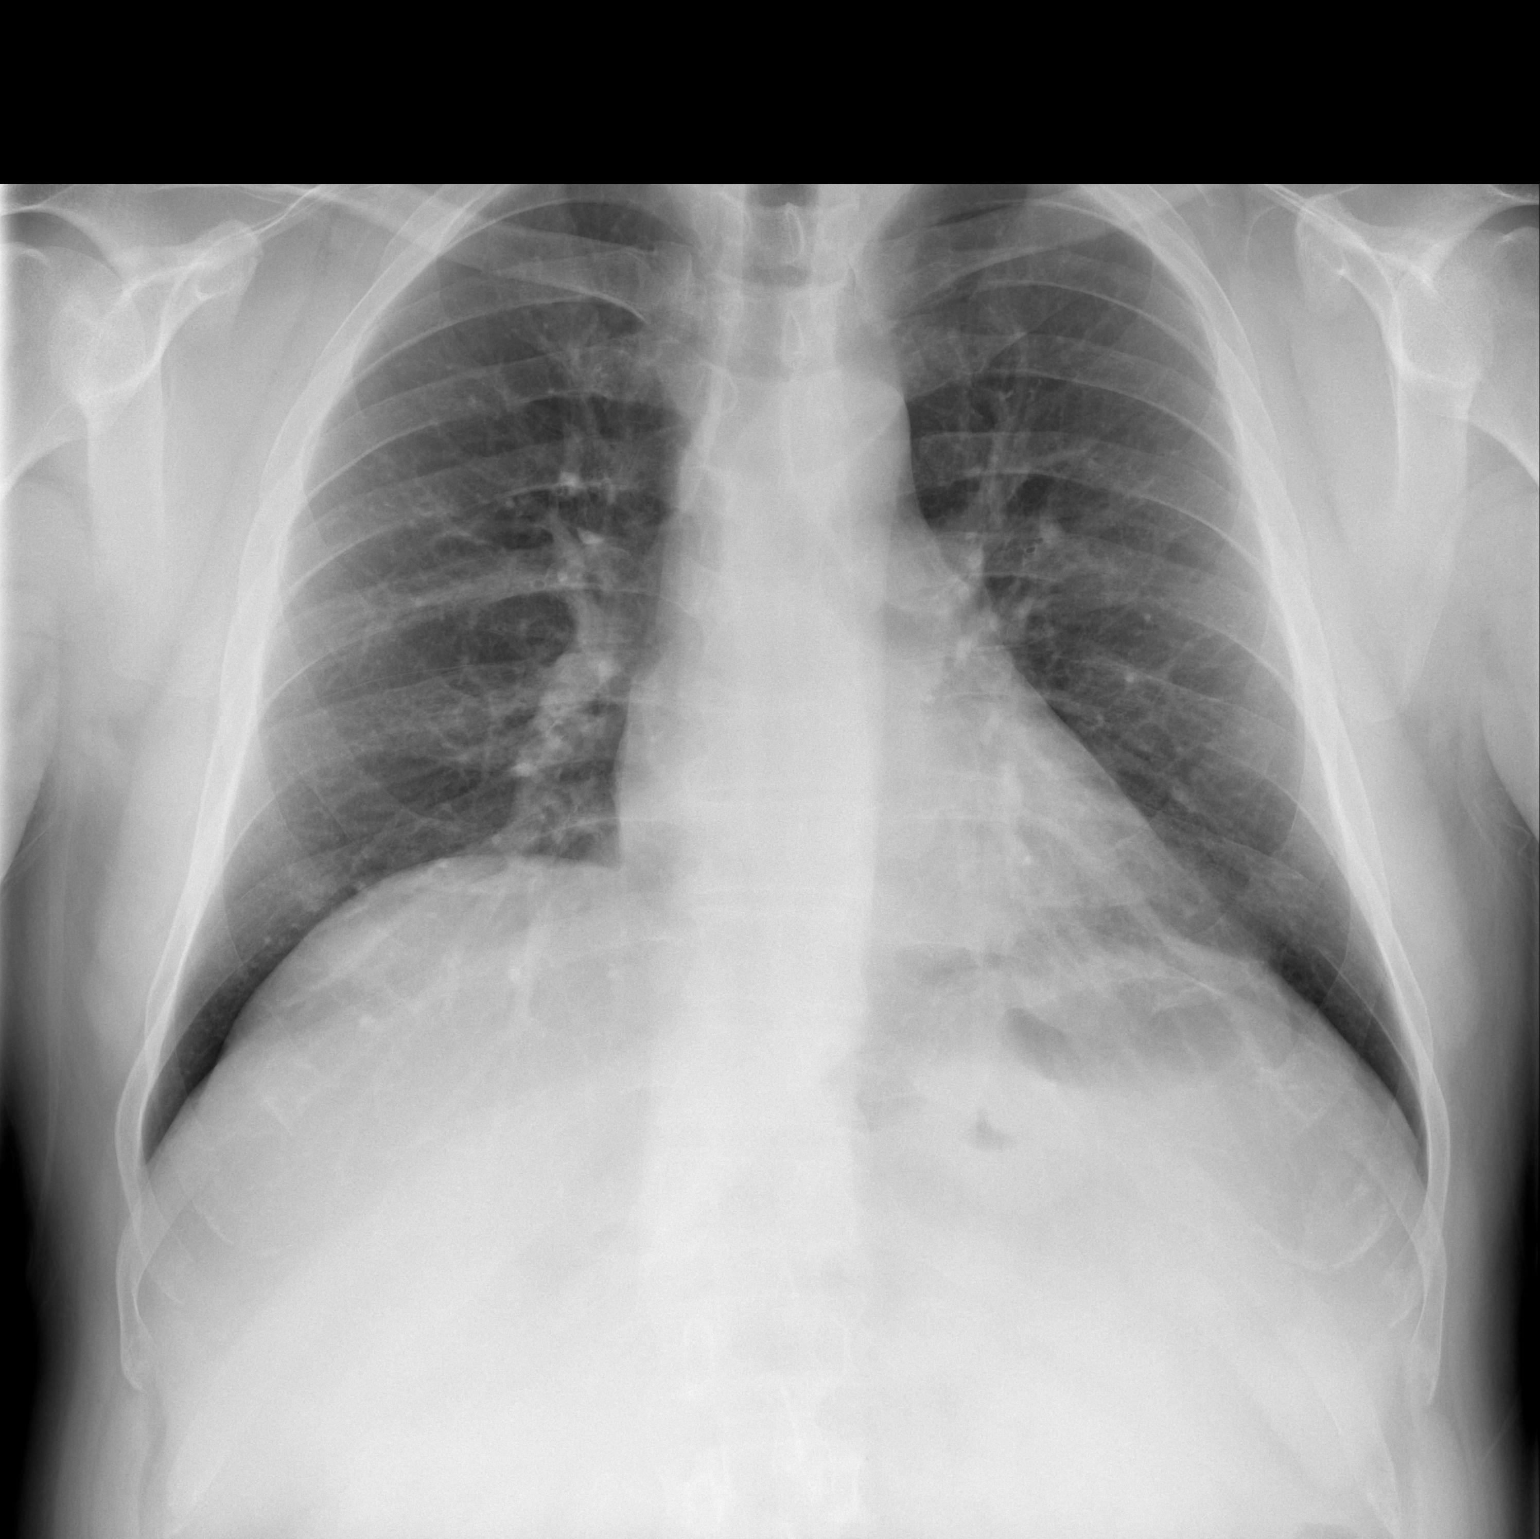

[w chest lat]
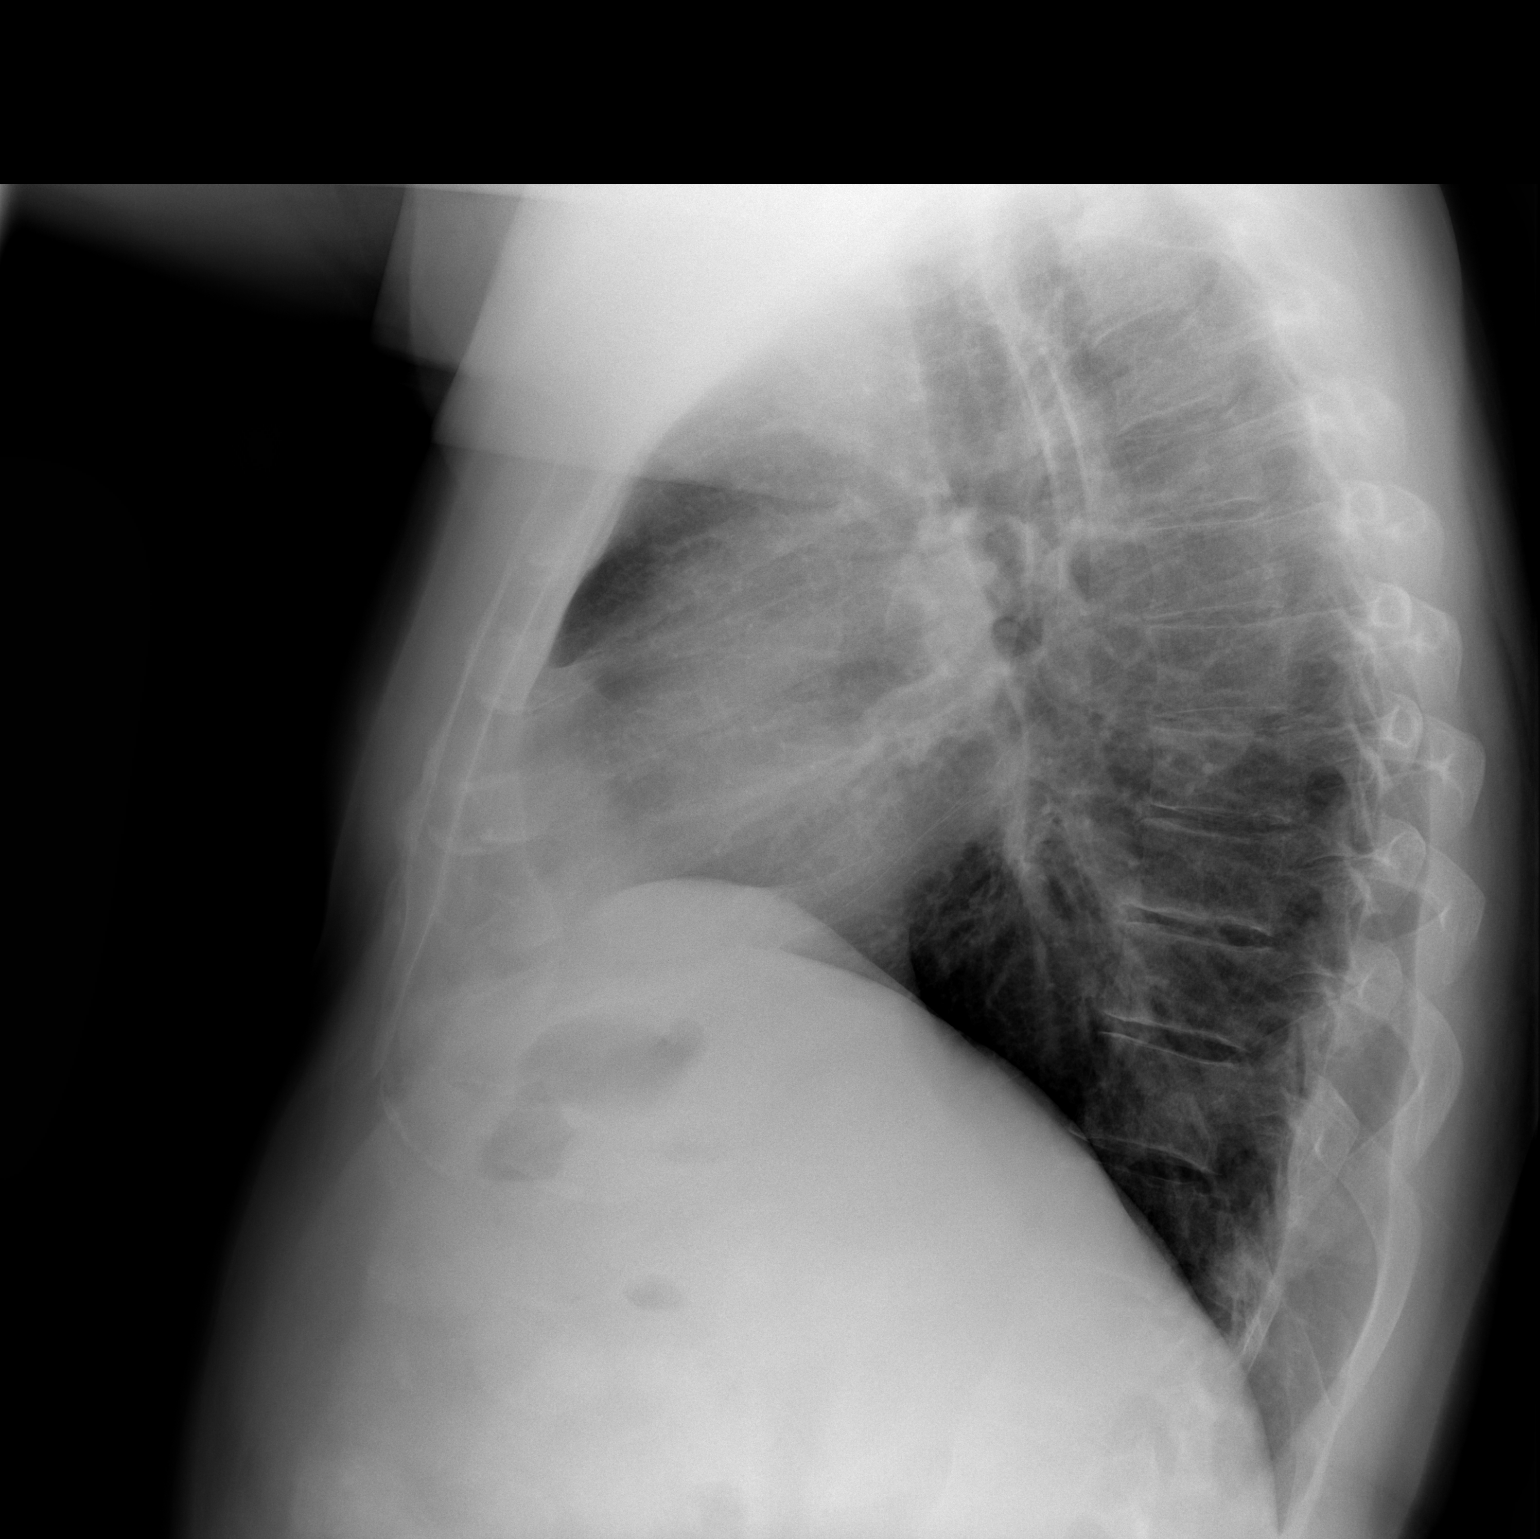

[2 of 2 positions shown; findings below may reference images not displayed]

FINDINGS: The lungs are adequately inflated and exhibit mild perihilar
interstitial markings. There is no alveolar infiltrate. There is
subtle nodular density projects in the posterior costophrenic gutter
on the lateral film that is not clearly evident on the frontal film.
The heart and mediastinal structures are normal. The bony structures
are unremarkable.
IMPRESSION: There is no pneumonia. Subtle nodularity in the posterior
costophrenic gutters on the lateral film only merits further
evaluation with chest CT scanning.

## 2016-04-10 IMAGING — CT CT CHEST W/ CM
2 of 3 series · 14 of 31 positions shown, 16 images · IV contrast (75CC OMNI 300)
Comparison: Chest radiograph 07/23/2013

CLINICAL DATA: Abnormal chest x-ray question lung nodule

EXAM:
CT CHEST WITH CONTRAST
TECHNIQUE: Multidetector CT imaging of the chest was performed during
intravenous contrast administration. Sagittal and coronal MPR images
reconstructed from axial data set.
CONTRAST:  75mL OMNIPAQUE IOHEXOL 300 MG/ML  SOLN IV

[Series 3: chest with · axial · 0.82mm/px · z∈[-219,+16]mm · 6 of 67 slices shown, 8 images]
[im 10/67  mediastinal]
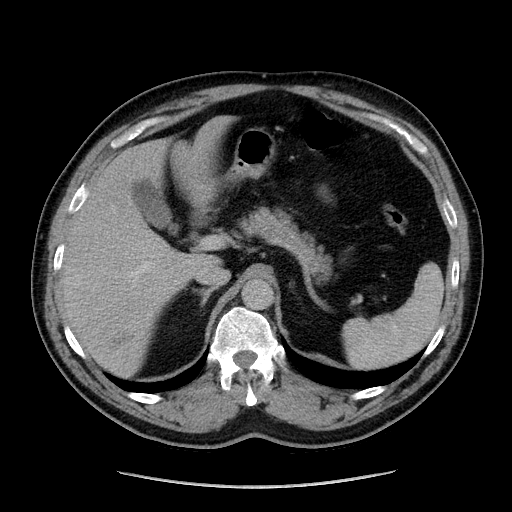
[im 10/67  lung]
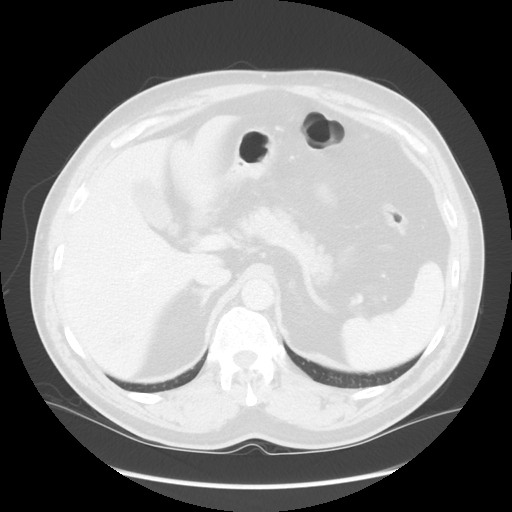
[im 19/67  lung]
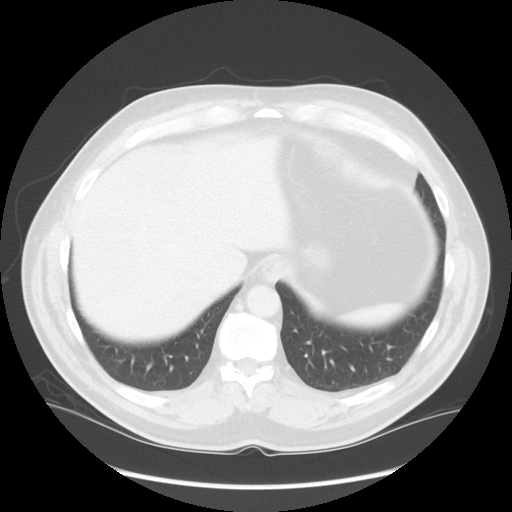
[im 29/67  lung]
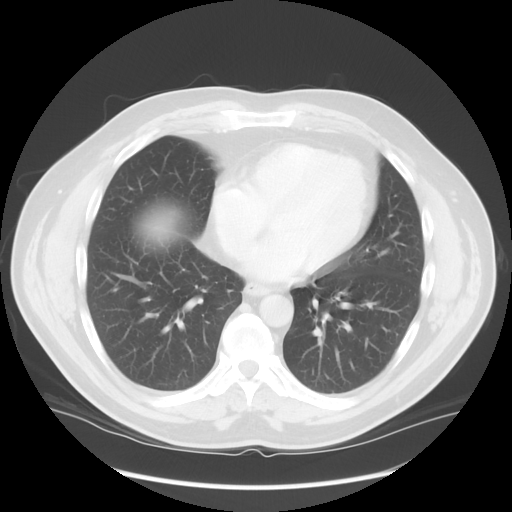
[im 38/67  lung]
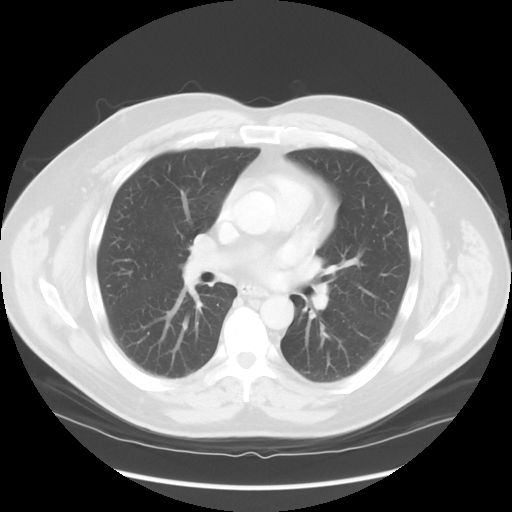
[im 48/67  mediastinal]
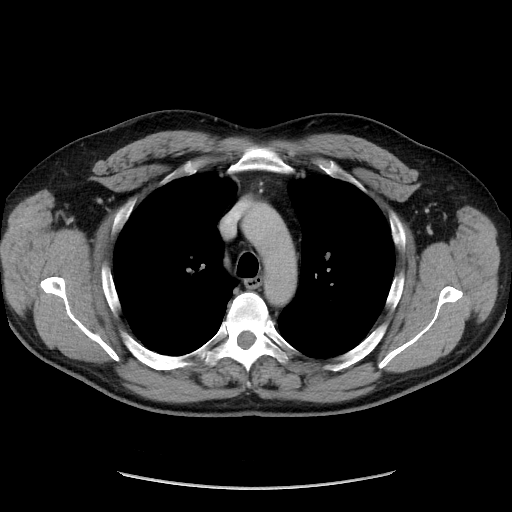
[im 48/67  lung]
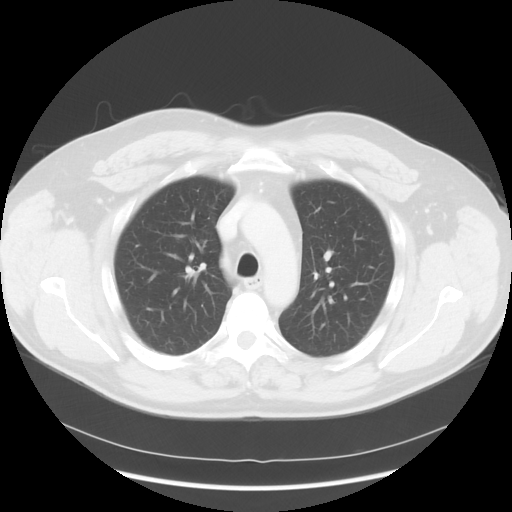
[im 57/67  lung]
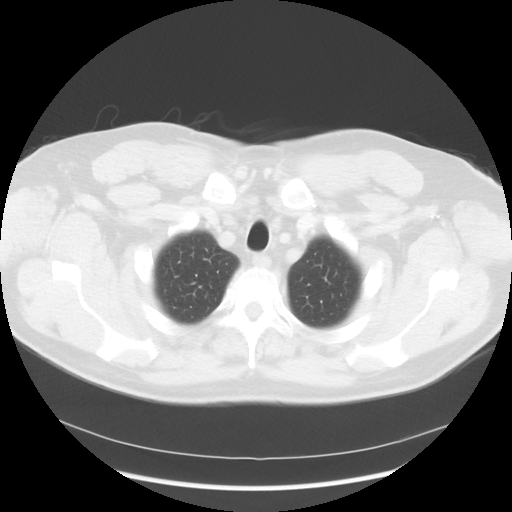

[Series 602: sagittal body · sagittal · 0.82mm/px · 8 of 169 slices shown]
[im 18/169  mediastinal]
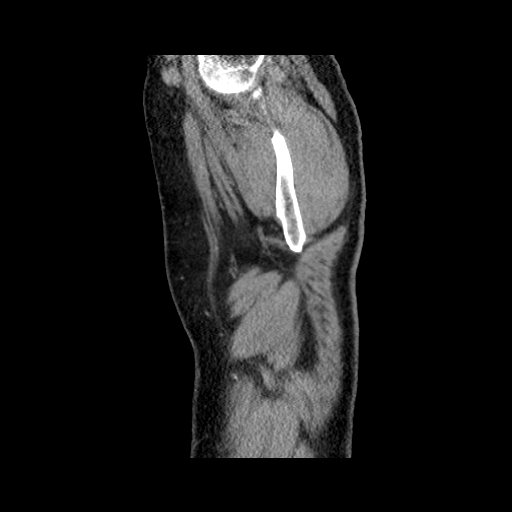
[im 36/169  mediastinal]
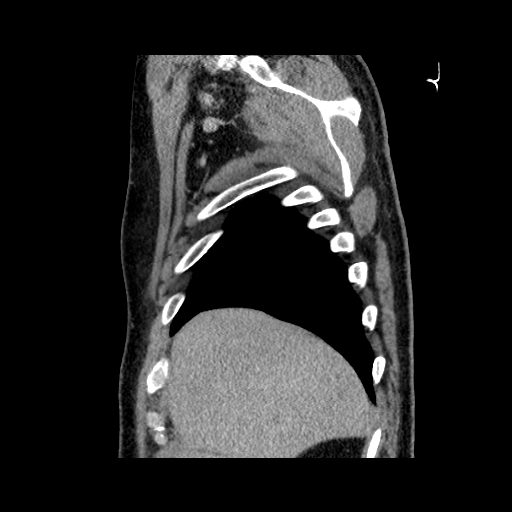
[im 54/169  mediastinal]
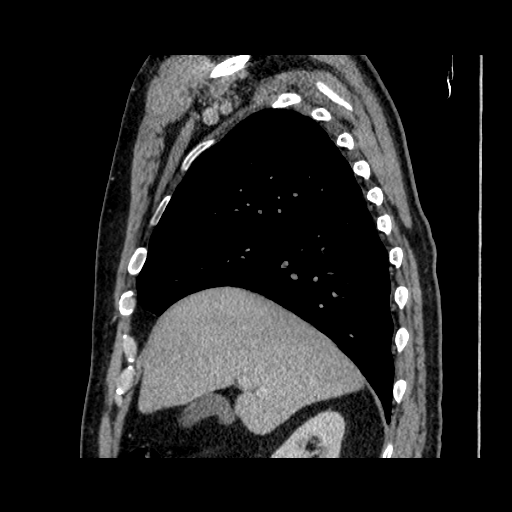
[im 80/169  mediastinal]
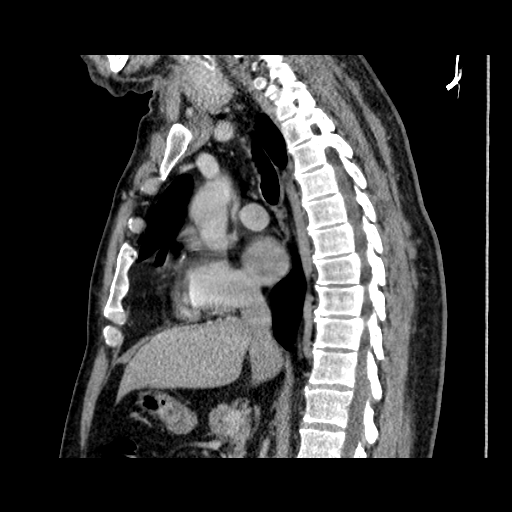
[im 89/169  mediastinal]
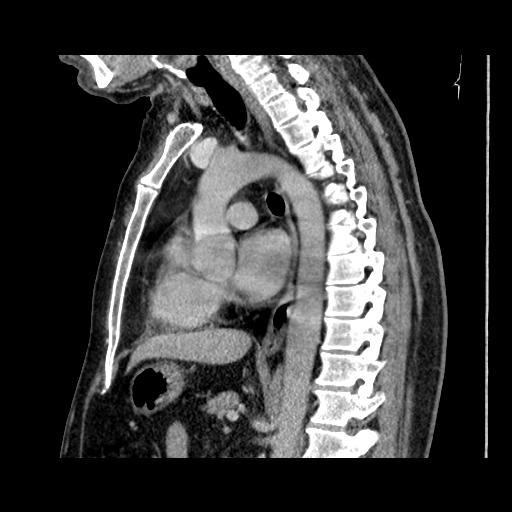
[im 115/169  mediastinal]
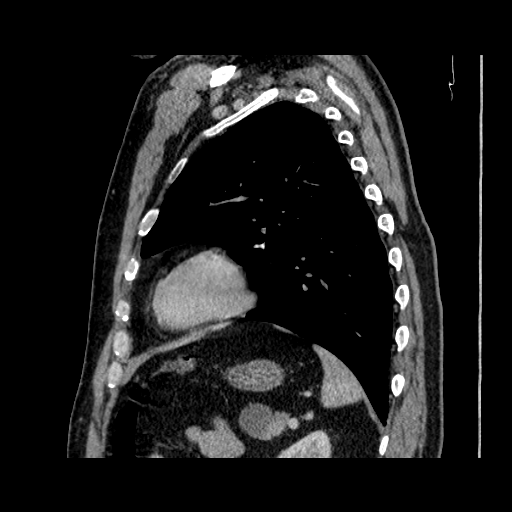
[im 133/169  mediastinal]
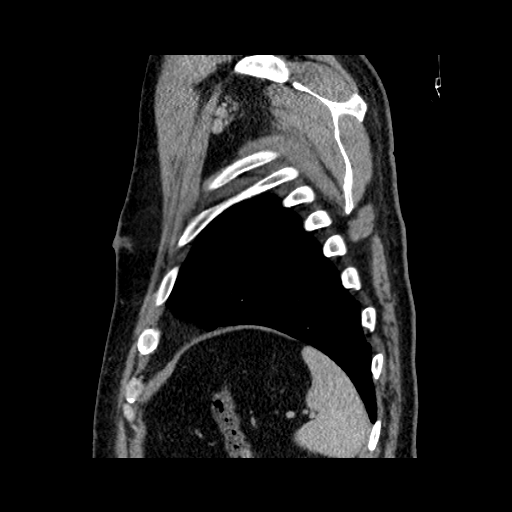
[im 151/169  mediastinal]
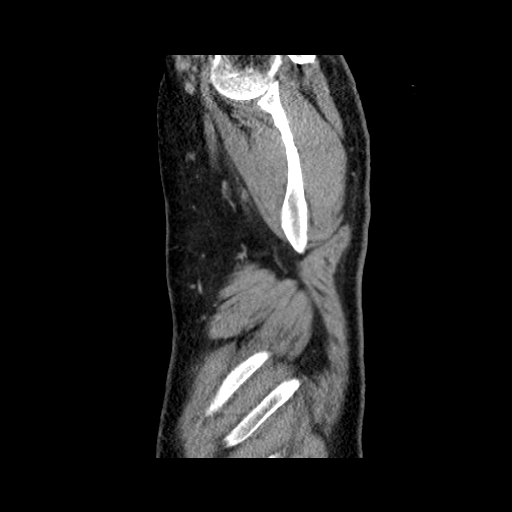

[14 of 31 positions shown; findings below may reference images not displayed]

FINDINGS: Vascular structures grossly patent on nondedicated exam.

Atherosclerotic calcification at coronary arteries.

Questionable area of low attenuation within the posterior RIGHT lobe
liver 2.0 x 1.4 x 1.5 cm image 57.

Cystic lesion at tail of pancreas, exophytic, 3.0 x 2.4 x 2.6 cm.

Remaining visualized portions of upper abdomen unremarkable.

No thoracic adenopathy.

Lung clear without infiltrate, pleural effusion or pneumothorax.

Specifically no pulmonary nodules identified.

Bones unremarkable.
IMPRESSION: No evidence of pulmonary mass/nodule ; preceding radiographic
finding likely represented a summation artifact.

Cystic mass at tail of pancreas 3.0 x 2.4 x 2.6 cm.

Questionable low-attenuation lesion at posterior RIGHT lobe liver
2.0 x 1.4 x 1.5 cm.

Further evaluation of these findings by MR imaging with and without
contrast recommended.

This recommendation follows ACR consensus guidelines: Managing
Incidental Findings on Abdominal CT: White Paper of the ACR
Incidental Findings Committee. [HOSPITAL] 7595;[DATE]

## 2016-04-12 DIAGNOSIS — M79601 Pain in right arm: Secondary | ICD-10-CM | POA: Diagnosis not present

## 2016-04-12 DIAGNOSIS — G5620 Lesion of ulnar nerve, unspecified upper limb: Secondary | ICD-10-CM | POA: Diagnosis not present

## 2016-04-12 DIAGNOSIS — M549 Dorsalgia, unspecified: Secondary | ICD-10-CM | POA: Diagnosis not present

## 2016-04-17 DIAGNOSIS — M4722 Other spondylosis with radiculopathy, cervical region: Secondary | ICD-10-CM | POA: Diagnosis not present

## 2016-04-27 DIAGNOSIS — M542 Cervicalgia: Secondary | ICD-10-CM | POA: Diagnosis not present

## 2016-05-06 DIAGNOSIS — M5412 Radiculopathy, cervical region: Secondary | ICD-10-CM | POA: Diagnosis not present

## 2016-05-08 DIAGNOSIS — M5013 Cervical disc disorder with radiculopathy, cervicothoracic region: Secondary | ICD-10-CM | POA: Diagnosis not present

## 2016-05-08 DIAGNOSIS — M542 Cervicalgia: Secondary | ICD-10-CM | POA: Diagnosis not present

## 2016-05-13 DIAGNOSIS — M5412 Radiculopathy, cervical region: Secondary | ICD-10-CM | POA: Diagnosis not present

## 2016-05-13 DIAGNOSIS — M542 Cervicalgia: Secondary | ICD-10-CM | POA: Diagnosis not present

## 2016-05-13 DIAGNOSIS — M5013 Cervical disc disorder with radiculopathy, cervicothoracic region: Secondary | ICD-10-CM | POA: Diagnosis not present

## 2016-05-15 DIAGNOSIS — E038 Other specified hypothyroidism: Secondary | ICD-10-CM | POA: Diagnosis not present

## 2016-05-15 DIAGNOSIS — M5013 Cervical disc disorder with radiculopathy, cervicothoracic region: Secondary | ICD-10-CM | POA: Diagnosis not present

## 2016-05-15 DIAGNOSIS — M542 Cervicalgia: Secondary | ICD-10-CM | POA: Diagnosis not present

## 2016-05-20 DIAGNOSIS — M542 Cervicalgia: Secondary | ICD-10-CM | POA: Diagnosis not present

## 2016-05-20 DIAGNOSIS — M5013 Cervical disc disorder with radiculopathy, cervicothoracic region: Secondary | ICD-10-CM | POA: Diagnosis not present

## 2016-05-21 DIAGNOSIS — M5412 Radiculopathy, cervical region: Secondary | ICD-10-CM | POA: Diagnosis not present

## 2016-05-27 DIAGNOSIS — M542 Cervicalgia: Secondary | ICD-10-CM | POA: Diagnosis not present

## 2016-05-27 DIAGNOSIS — M5013 Cervical disc disorder with radiculopathy, cervicothoracic region: Secondary | ICD-10-CM | POA: Diagnosis not present

## 2016-06-04 DIAGNOSIS — M5412 Radiculopathy, cervical region: Secondary | ICD-10-CM | POA: Diagnosis not present

## 2016-07-19 DIAGNOSIS — M5412 Radiculopathy, cervical region: Secondary | ICD-10-CM | POA: Diagnosis not present

## 2016-07-23 ENCOUNTER — Other Ambulatory Visit: Payer: Self-pay | Admitting: Orthopedic Surgery

## 2016-07-24 DIAGNOSIS — M5412 Radiculopathy, cervical region: Secondary | ICD-10-CM | POA: Diagnosis not present

## 2016-07-26 ENCOUNTER — Ambulatory Visit (HOSPITAL_COMMUNITY)
Admission: RE | Admit: 2016-07-26 | Discharge: 2016-07-26 | Disposition: A | Discharge status: Home / Self Care | Payer: 59 | Source: Ambulatory Visit | Attending: Orthopedic Surgery | Admitting: Orthopedic Surgery

## 2016-07-26 ENCOUNTER — Encounter (HOSPITAL_COMMUNITY)
Admission: RE | Admit: 2016-07-26 | Discharge: 2016-07-26 | Disposition: A | Discharge status: Home / Self Care | Payer: 59 | Source: Ambulatory Visit | Attending: Orthopedic Surgery | Admitting: Orthopedic Surgery

## 2016-07-26 ENCOUNTER — Encounter (HOSPITAL_COMMUNITY): Payer: Self-pay

## 2016-07-26 DIAGNOSIS — E039 Hypothyroidism, unspecified: Secondary | ICD-10-CM | POA: Diagnosis not present

## 2016-07-26 DIAGNOSIS — Z0389 Encounter for observation for other suspected diseases and conditions ruled out: Secondary | ICD-10-CM | POA: Diagnosis not present

## 2016-07-26 DIAGNOSIS — E785 Hyperlipidemia, unspecified: Secondary | ICD-10-CM | POA: Diagnosis not present

## 2016-07-26 DIAGNOSIS — Z01812 Encounter for preprocedural laboratory examination: Secondary | ICD-10-CM | POA: Insufficient documentation

## 2016-07-26 DIAGNOSIS — Z01810 Encounter for preprocedural cardiovascular examination: Secondary | ICD-10-CM | POA: Diagnosis present

## 2016-07-26 DIAGNOSIS — Z01818 Encounter for other preprocedural examination: Secondary | ICD-10-CM | POA: Diagnosis present

## 2016-07-26 DIAGNOSIS — K862 Cyst of pancreas: Secondary | ICD-10-CM | POA: Insufficient documentation

## 2016-07-26 DIAGNOSIS — Z79899 Other long term (current) drug therapy: Secondary | ICD-10-CM | POA: Insufficient documentation

## 2016-07-26 DIAGNOSIS — K219 Gastro-esophageal reflux disease without esophagitis: Secondary | ICD-10-CM | POA: Insufficient documentation

## 2016-07-26 LAB — CBC WITH DIFFERENTIAL/PLATELET
Basophils Absolute: 0 10*3/uL (ref 0.0–0.1)
Basophils Relative: 1 %
EOS PCT: 2 %
Eosinophils Absolute: 0.2 10*3/uL (ref 0.0–0.7)
HCT: 50.7 % (ref 39.0–52.0)
Hemoglobin: 17.3 g/dL — ABNORMAL HIGH (ref 13.0–17.0)
LYMPHS ABS: 2.2 10*3/uL (ref 0.7–4.0)
Lymphocytes Relative: 25 %
MCH: 30.4 pg (ref 26.0–34.0)
MCHC: 34.1 g/dL (ref 30.0–36.0)
MCV: 89.1 fL (ref 78.0–100.0)
Monocytes Absolute: 0.8 10*3/uL (ref 0.1–1.0)
Monocytes Relative: 9 %
Neutro Abs: 5.4 10*3/uL (ref 1.7–7.7)
Neutrophils Relative %: 63 %
PLATELETS: 218 10*3/uL (ref 150–400)
RBC: 5.69 MIL/uL (ref 4.22–5.81)
RDW: 13.4 % (ref 11.5–15.5)
WBC: 8.6 10*3/uL (ref 4.0–10.5)

## 2016-07-26 LAB — COMPREHENSIVE METABOLIC PANEL
ALBUMIN: 4.6 g/dL (ref 3.5–5.0)
ALT: 37 U/L (ref 17–63)
AST: 28 U/L (ref 15–41)
Alkaline Phosphatase: 52 U/L (ref 38–126)
Anion gap: 9 (ref 5–15)
BUN: 13 mg/dL (ref 6–20)
CHLORIDE: 104 mmol/L (ref 101–111)
CO2: 26 mmol/L (ref 22–32)
Calcium: 9.5 mg/dL (ref 8.9–10.3)
Creatinine, Ser: 1.4 mg/dL — ABNORMAL HIGH (ref 0.61–1.24)
GFR calc non Af Amer: 56 mL/min — ABNORMAL LOW (ref >60)
Glucose, Bld: 95 mg/dL (ref 65–99)
POTASSIUM: 3.8 mmol/L (ref 3.5–5.1)
Sodium: 139 mmol/L (ref 135–145)
Total Bilirubin: 0.7 mg/dL (ref 0.3–1.2)
Total Protein: 7.3 g/dL (ref 6.5–8.1)

## 2016-07-26 LAB — URINALYSIS, ROUTINE W REFLEX MICROSCOPIC
Bilirubin Urine: NEGATIVE
Glucose, UA: NEGATIVE mg/dL
Hgb urine dipstick: NEGATIVE
Ketones, ur: NEGATIVE mg/dL
Leukocytes, UA: NEGATIVE
Nitrite: NEGATIVE
Protein, ur: NEGATIVE mg/dL
Specific Gravity, Urine: 1.016 (ref 1.005–1.030)
pH: 7 (ref 5.0–8.0)

## 2016-07-26 LAB — SURGICAL PCR SCREEN
MRSA, PCR: NEGATIVE
Staphylococcus aureus: NEGATIVE

## 2016-07-26 LAB — APTT: aPTT: 35 seconds (ref 24–36)

## 2016-07-26 LAB — PROTIME-INR
INR: 1.05
Prothrombin Time: 13.7 seconds (ref 11.4–15.2)

## 2016-07-26 NOTE — Pre-Procedure Instructions (Signed)
Tyler Smith  07/26/2016      [id number] [place] [facility] [facility] [place] [address] [place] [id number] Phone: [phone] Fax: [phone]    Your procedure is scheduled on 08-01-2014  Wednesday .  [facility].M.   Call this number if you have problems the morning of surgery:  [phone]   Remember:  Do not eat food or drink liquids after midnight.   Take these medicines the morning of surgery with A SIP OF WATER Tylenol if needed,bupropion(Wellbutrin),levothyroxin(Synthroid),Symbicort Inhaler  STOP ASPIRIN,ANTIINFLAMATORIES (IBUPROFEN,ALEVE,MOTRIN,ADVIL,GOODY'S POWDERS),HERBAL SUPPLEMENTS,FISH OIL,AND VITAMINS 5-7 DAYS PRIOR TO SURGERY     Do not wear jewelry, make-up or nail polish.  Do not wear lotions, powders, or perfumes, or deoderant.  Do not shave 48 hours prior to surgery.  Men may shave face and neck.  Do not bring valuables to the hospital.  [facility] not [facility] for any belongings or valuables.  Contacts, dentures or bridgework may not be worn into surgery.  Leave your suitcase in the car.  After surgery it may be brought to your room.  For patients admitted to the hospital, discharge time will be determined by your treatment team.  Patients discharged the day of surgery will not be allowed to drive home.     Special Instructions: [place] - Preparing for Surgery  Before surgery, you can play an important role.  Because skin is not sterile, your skin needs to be as free of germs as possible.  You can reduce the number of germs on you skin by washing with CHG (chlorahexidine gluconate) soap before surgery.  CHG is an antiseptic cleaner which kills germs and bonds with the skin to continue killing germs even after washing.  Please DO NOT use if you have an allergy to CHG or antibacterial soaps.  If your skin becomes reddened/irritated stop using the CHG and inform your nurse  when you arrive at Short Stay.  Do not shave (including legs and underarms) for at least 48 hours prior to the first CHG shower.  You may shave your face.  Please follow these instructions carefully:   1.  Shower with CHG Soap the night before surgery and the   morning of Surgery.  2.  If you choose to wash your hair, wash your hair first as usual with your normal shampoo.  3.  After you shampoo, rinse your hair and body thoroughly to remove the  Shampoo.  4.  Use CHG as you would any other liquid soap.  You can apply chg directly  to the skin and wash gently with scrungie or a clean washcloth.  5.  Apply the CHG Soap to your body ONLY FROM THE NECK DOWN.   Do not use on open wounds or open sores.  Avoid contact with your eyes,  ears, mouth and genitals (private parts).  Wash genitals (private parts) with your normal soap.  6.  Wash thoroughly, paying special attention to the area where your surgery will be performed.  7.  Thoroughly rinse your body with warm water from the neck down.  8.  DO NOT shower/wash with your normal soap after using and rinsing o  the CHG Soap.  9.  Pat yourself dry with a clean towel.            10.  Wear clean pajamas.            11.  Place clean sheets on your bed the night of your first shower and do not sleep with pets.  Day of Surgery  Do not apply any lotions/deodorants the morning of surgery.  Please wear clean clothes to the hospital/surgery center.   Please read over the following fact sheets that you were given. Surgical Site Infection Prevention,Incentive Spirometry

## 2016-07-26 NOTE — Progress Notes (Signed)
Denies any Cardiac history  Denies any Cardiac testing

## 2016-07-29 ENCOUNTER — Other Ambulatory Visit (HOSPITAL_COMMUNITY): Payer: 59

## 2016-07-29 NOTE — H&P (Signed)
     PREOPERATIVE H&P  Chief Complaint: Right arm pain  HPI: Tyler Smith is a 56 y.o. male who presents with ongoing pain in the right arm  MRI reveals NF stenosis C5-T1  Patient has failed multiple forms of conservative care and continues to have pain (see office notes for additional details regarding the patient's full course of treatment)  Past Medical History:  Diagnosis Date  . GERD (gastroesophageal reflux disease)    none recent  . Hypercholesterolemia   . Hypothyroidism   . Pancreatic cyst   . Thyroid disease    Past Surgical History:  Procedure Laterality Date  . EUS N/A 09/01/2013   Procedure: ESOPHAGEAL ENDOSCOPIC ULTRASOUND (EUS) RADIAL;  Surgeon: Willis ModenaWilliam Outlaw, MD;  Location: [facility];  Service: Endoscopy;  Laterality: N/A;  . EYE SURGERY Bilateral age 33   lazy eye  . FINE NEEDLE ASPIRATION N/A 09/01/2013   Procedure: FINE NEEDLE ASPIRATION (FNA) LINEAR;  Surgeon: Willis ModenaWilliam Outlaw, MD;  Location: [facility];  Service: Endoscopy;  Laterality: N/A;  . pinkie finger surgfery Right yrs ago   Social History   Social History  . Marital status: Married    Spouse name: N/A  . Number of children: N/A  . Years of education: N/A   Social History Main Topics  . Smoking status: Never Smoker  . Smokeless tobacco: Never Used  . Alcohol use Yes     Comment: occasional  . Drug use: No  . Sexual activity: Yes   Other Topics Concern  . Not on file   Social History Narrative  . No narrative on file   Family History  Problem Relation Age of Onset  . Cancer - Other Mother   . COPD Father   . Coronary artery disease Brother   . Coronary artery disease Brother    Allergies  Allergen Reactions  . Penicillins Hives   Prior to Admission medications   Medication Sig Start Date End Date Taking? Authorizing Provider  acetaminophen (TYLENOL) 325 MG tablet Take 650 mg by mouth daily as needed for moderate pain.    Yes [provider]  buPROPion  (WELLBUTRIN XL) 150 MG 24 hr tablet Take 150 mg by mouth daily.   Yes [provider]  levothyroxine (SYNTHROID, LEVOTHROID) 137 MCG tablet Take 137 mcg by mouth daily before breakfast.    Yes [provider]  SYMBICORT 80-4.5 MCG/ACT inhaler Inhale 2 puffs into the lungs 2 (two) times daily. 07/18/16  Yes [provider]  Turmeric 500 MG CAPS Take 1 capsule by mouth daily.   Yes [provider]  aspirin EC 81 MG tablet Take 81 mg by mouth at bedtime.     [provider]     All other systems have been reviewed and were otherwise negative with the exception of those mentioned in the HPI and as above.  Physical Exam: There were no vitals filed for this visit.  General: Alert, no acute distress Cardiovascular: No pedal edema Respiratory: No cyanosis, no use of accessory musculature Skin: No lesions in the area of chief complaint Neurologic: Sensation intact distally Psychiatric: Patient is competent for consent with normal mood and affect Lymphatic: No axillary or cervical lymphadenopathy  MUSCULOSKELETAL: + spurling's sign on the right  Assessment/Plan: Right sided radiculopathy Plan for Procedure(s): ANTERIOR CERVICAL DECOMPRESSION FUSION, CERVICAL 5-6, CERVICAL 6-7, CERVICAL 7 -THORACIC 1 WITH INSTRUMENTATION AND ALLOGRAFT   Emilee HeroUMONSKI,Landynn Dupler LEONARD, MD 07/29/2016 8:41 AM

## 2016-07-30 MED ORDER — VANCOMYCIN HCL 10 G IV SOLR
1500.0000 mg | INTRAVENOUS | Status: AC
  Administered 2016-07-31: 1500 mg via INTRAVENOUS
  Filled 2016-07-30: qty 1500

## 2016-07-30 NOTE — Progress Notes (Signed)
Anesthesia Chart Review:  Pt is a 56 year old male scheduled for C5-6, C6-7, C7-T1 ACDF on 07/31/2016 with Estill BambergMark Dumonski, MD  - PCP is L. 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 Never smoker. BMI 31.5  Medications include: ASA 81mg , levothyroxine, symbicort.   BP (!) 140/92   Pulse 92   Temp 36.7 C   Resp 20   Ht 5\' 11"  (1.803 m)   Wt 225 lb 6.4 oz (102.2 kg)   SpO2 97%   BMI 31.44 kg/m    Preoperative labs reviewed.  Cr 1.4, BUN 13.  Pt's usual baseline Cr ~1 at PCP's office.  - I spoke with pt by telephone.  He is not taking NSAIDs or tylenol.  He will increase water intake. Will recheck BMET DOS.   CXR 07/26/16: There is no pneumonia, CHF, nor other acute cardiopulmonary abnormality.  EKG 07/26/16: NSR  If no changes, I anticipate pt can proceed with surgery as scheduled.   Rica Mastngela Greogry Goodwyn, FNP-BC [facility] Phone: [phone] 07/30/2016 9:40 AM

## 2016-07-31 ENCOUNTER — Encounter (HOSPITAL_COMMUNITY): Payer: Self-pay

## 2016-07-31 ENCOUNTER — Encounter (HOSPITAL_COMMUNITY)
Admission: RE | Disposition: A | Discharge status: Home / Self Care | Payer: Self-pay | Source: Ambulatory Visit | Attending: Orthopedic Surgery

## 2016-07-31 ENCOUNTER — Ambulatory Visit (HOSPITAL_COMMUNITY): Payer: Commercial Managed Care - HMO | Admitting: Emergency Medicine

## 2016-07-31 ENCOUNTER — Observation Stay (HOSPITAL_COMMUNITY)
Admission: RE | Admit: 2016-07-31 | Discharge: 2016-08-01 | Disposition: A | Discharge status: Home / Self Care | Payer: Commercial Managed Care - HMO | Source: Ambulatory Visit | Attending: Orthopedic Surgery | Admitting: Orthopedic Surgery

## 2016-07-31 ENCOUNTER — Ambulatory Visit (HOSPITAL_COMMUNITY): Payer: Commercial Managed Care - HMO

## 2016-07-31 ENCOUNTER — Ambulatory Visit (HOSPITAL_COMMUNITY)
Admission: RE | Disposition: A | Discharge status: Home / Self Care | Payer: Self-pay | Source: Ambulatory Visit | Attending: Orthopedic Surgery

## 2016-07-31 DIAGNOSIS — E039 Hypothyroidism, unspecified: Secondary | ICD-10-CM | POA: Diagnosis not present

## 2016-07-31 DIAGNOSIS — Z79899 Other long term (current) drug therapy: Secondary | ICD-10-CM | POA: Diagnosis not present

## 2016-07-31 DIAGNOSIS — Z419 Encounter for procedure for purposes other than remedying health state, unspecified: Secondary | ICD-10-CM

## 2016-07-31 DIAGNOSIS — M4322 Fusion of spine, cervical region: Secondary | ICD-10-CM | POA: Diagnosis not present

## 2016-07-31 DIAGNOSIS — M4803 Spinal stenosis, cervicothoracic region: Principal | ICD-10-CM | POA: Insufficient documentation

## 2016-07-31 DIAGNOSIS — M5412 Radiculopathy, cervical region: Secondary | ICD-10-CM | POA: Diagnosis not present

## 2016-07-31 DIAGNOSIS — Z7982 Long term (current) use of aspirin: Secondary | ICD-10-CM | POA: Insufficient documentation

## 2016-07-31 DIAGNOSIS — M4802 Spinal stenosis, cervical region: Secondary | ICD-10-CM | POA: Diagnosis not present

## 2016-07-31 DIAGNOSIS — M5410 Radiculopathy, site unspecified: Secondary | ICD-10-CM | POA: Diagnosis present

## 2016-07-31 DIAGNOSIS — K862 Cyst of pancreas: Secondary | ICD-10-CM | POA: Diagnosis not present

## 2016-07-31 HISTORY — PX: ANTERIOR CERVICAL DECOMP/DISCECTOMY FUSION: SHX1161

## 2016-07-31 LAB — BASIC METABOLIC PANEL
Anion gap: 8 (ref 5–15)
BUN: 9 mg/dL (ref 6–20)
CALCIUM: 9.2 mg/dL (ref 8.9–10.3)
CO2: 25 mmol/L (ref 22–32)
Chloride: 105 mmol/L (ref 101–111)
Creatinine, Ser: 1.1 mg/dL (ref 0.61–1.24)
GFR calc Af Amer: >60 mL/min (ref >60)
Glucose, Bld: 102 mg/dL — ABNORMAL HIGH (ref 65–99)
Potassium: 3.8 mmol/L (ref 3.5–5.1)
Sodium: 138 mmol/L (ref 135–145)

## 2016-07-31 SURGERY — ANTERIOR CERVICAL DECOMPRESSION/DISCECTOMY FUSION 3 LEVELS
Anesthesia: General | Laterality: Right

## 2016-07-31 SURGERY — ANTERIOR CERVICAL DECOMPRESSION/DISCECTOMY FUSION 3 LEVELS
Anesthesia: General | Site: Neck

## 2016-07-31 MED ORDER — DEXAMETHASONE SODIUM PHOSPHATE 10 MG/ML IJ SOLN
INTRAMUSCULAR | Status: AC
  Filled 2016-07-31: qty 1

## 2016-07-31 MED ORDER — ONDANSETRON HCL 4 MG PO TABS: 4.0000 mg | ORAL_TABLET | Freq: Four times a day (QID) | ORAL | Status: DC | PRN

## 2016-07-31 MED ORDER — LIDOCAINE 2% (20 MG/ML) 5 ML SYRINGE
INTRAMUSCULAR | Status: AC
  Filled 2016-07-31: qty 5

## 2016-07-31 MED ORDER — PROPOFOL 10 MG/ML IV BOLUS
INTRAVENOUS | Status: DC | PRN
  Administered 2016-07-31: 170 mg via INTRAVENOUS

## 2016-07-31 MED ORDER — EPHEDRINE SULFATE 50 MG/ML IJ SOLN
INTRAMUSCULAR | Status: DC | PRN
  Administered 2016-07-31 (×3): 10 mg via INTRAVENOUS

## 2016-07-31 MED ORDER — OXYCODONE HCL 5 MG/5ML PO SOLN: 5.0000 mg | Freq: Once | ORAL | Status: DC | PRN

## 2016-07-31 MED ORDER — BUPIVACAINE-EPINEPHRINE 0.25% -1:200000 IJ SOLN
INTRAMUSCULAR | Status: DC | PRN
  Administered 2016-07-31: 5 mL

## 2016-07-31 MED ORDER — SODIUM CHLORIDE 0.9% FLUSH: 3.0000 mL | Freq: Two times a day (BID) | INTRAVENOUS | Status: DC

## 2016-07-31 MED ORDER — LIDOCAINE 2% (20 MG/ML) 5 ML SYRINGE
INTRAMUSCULAR | Status: DC | PRN
Start: 2016-07-31 — End: 2016-07-31
  Administered 2016-07-31: 100 mg via INTRAVENOUS

## 2016-07-31 MED ORDER — HYDROMORPHONE HCL 1 MG/ML IJ SOLN
INTRAMUSCULAR | Status: AC
  Filled 2016-07-31: qty 0.5

## 2016-07-31 MED ORDER — BUPIVACAINE LIPOSOME 1.3 % IJ SUSP
20.0000 mL | Freq: Once | INTRAMUSCULAR | Status: DC
  Filled 2016-07-31: qty 20

## 2016-07-31 MED ORDER — 0.9 % SODIUM CHLORIDE (POUR BTL) OPTIME
TOPICAL | Status: DC | PRN
  Administered 2016-07-31: 1000 mL

## 2016-07-31 MED ORDER — DOCUSATE SODIUM 100 MG PO CAPS
100.0000 mg | ORAL_CAPSULE | Freq: Two times a day (BID) | ORAL | Status: DC
  Administered 2016-07-31: 100 mg via ORAL
  Filled 2016-07-31: qty 1

## 2016-07-31 MED ORDER — ZOLPIDEM TARTRATE 5 MG PO TABS: 5.0000 mg | ORAL_TABLET | Freq: Every evening | ORAL | Status: DC | PRN

## 2016-07-31 MED ORDER — THROMBIN 20000 UNITS EX SOLR
CUTANEOUS | Status: AC
  Filled 2016-07-31: qty 20000

## 2016-07-31 MED ORDER — ACETAMINOPHEN 650 MG RE SUPP: 650.0000 mg | RECTAL | Status: DC | PRN

## 2016-07-31 MED ORDER — MEPERIDINE HCL 25 MG/ML IJ SOLN: 6.2500 mg | INTRAMUSCULAR | Status: DC | PRN

## 2016-07-31 MED ORDER — LEVOTHYROXINE SODIUM 137 MCG PO TABS
137.0000 ug | ORAL_TABLET | Freq: Every day | ORAL | Status: DC
  Administered 2016-08-01: 137 ug via ORAL
  Filled 2016-07-31: qty 1

## 2016-07-31 MED ORDER — VANCOMYCIN HCL 10 G IV SOLR
1500.0000 mg | Freq: Once | INTRAVENOUS | Status: AC
  Administered 2016-07-31: 1500 mg via INTRAVENOUS
  Filled 2016-07-31: qty 1500

## 2016-07-31 MED ORDER — ONDANSETRON HCL 4 MG/2ML IJ SOLN
INTRAMUSCULAR | Status: AC
  Filled 2016-07-31: qty 2

## 2016-07-31 MED ORDER — OXYCODONE HCL 5 MG PO TABS: 5.0000 mg | ORAL_TABLET | Freq: Once | ORAL | Status: DC | PRN

## 2016-07-31 MED ORDER — ROCURONIUM BROMIDE 10 MG/ML (PF) SYRINGE
PREFILLED_SYRINGE | INTRAVENOUS | Status: DC | PRN
  Administered 2016-07-31 (×2): 20 mg via INTRAVENOUS
  Administered 2016-07-31: 10 mg via INTRAVENOUS
  Administered 2016-07-31: 50 mg via INTRAVENOUS

## 2016-07-31 MED ORDER — DIAZEPAM 5 MG PO TABS
5.0000 mg | ORAL_TABLET | Freq: Four times a day (QID) | ORAL | Status: DC | PRN
  Administered 2016-07-31 – 2016-08-01 (×2): 5 mg via ORAL
  Filled 2016-07-31 (×2): qty 1

## 2016-07-31 MED ORDER — MIDAZOLAM HCL 5 MG/5ML IJ SOLN
INTRAMUSCULAR | Status: DC | PRN
  Administered 2016-07-31: 2 mg via INTRAVENOUS

## 2016-07-31 MED ORDER — ONDANSETRON HCL 4 MG/2ML IJ SOLN
INTRAMUSCULAR | Status: DC | PRN
  Administered 2016-07-31: 4 mg via INTRAVENOUS

## 2016-07-31 MED ORDER — ALUM & MAG HYDROXIDE-SIMETH 200-200-20 MG/5ML PO SUSP: 30.0000 mL | Freq: Four times a day (QID) | ORAL | Status: DC | PRN

## 2016-07-31 MED ORDER — MENTHOL 3 MG MT LOZG
1.0000 | LOZENGE | OROMUCOSAL | Status: DC | PRN
  Filled 2016-07-31: qty 9

## 2016-07-31 MED ORDER — DEXAMETHASONE SODIUM PHOSPHATE 10 MG/ML IJ SOLN
INTRAMUSCULAR | Status: DC | PRN
  Administered 2016-07-31: 10 mg via INTRAVENOUS

## 2016-07-31 MED ORDER — MORPHINE SULFATE (PF) 4 MG/ML IV SOLN: 1.0000 mg | INTRAVENOUS | Status: DC | PRN

## 2016-07-31 MED ORDER — POTASSIUM CHLORIDE IN NACL 20-0.9 MEQ/L-% IV SOLN: INTRAVENOUS | Status: DC

## 2016-07-31 MED ORDER — POVIDONE-IODINE 7.5 % EX SOLN: Freq: Once | CUTANEOUS | Status: DC

## 2016-07-31 MED ORDER — PHENOL 1.4 % MT LIQD
1.0000 | OROMUCOSAL | Status: DC | PRN
  Administered 2016-08-01: 1 via OROMUCOSAL
  Filled 2016-07-31: qty 177

## 2016-07-31 MED ORDER — BUPIVACAINE-EPINEPHRINE (PF) 0.25% -1:200000 IJ SOLN
INTRAMUSCULAR | Status: AC
  Filled 2016-07-31: qty 30

## 2016-07-31 MED ORDER — ONDANSETRON HCL 4 MG/2ML IJ SOLN: 4.0000 mg | Freq: Four times a day (QID) | INTRAMUSCULAR | Status: DC | PRN

## 2016-07-31 MED ORDER — FLEET ENEMA 7-19 GM/118ML RE ENEM: 1.0000 | ENEMA | Freq: Once | RECTAL | Status: DC | PRN

## 2016-07-31 MED ORDER — MIDAZOLAM HCL 2 MG/2ML IJ SOLN
INTRAMUSCULAR | Status: AC
  Filled 2016-07-31: qty 2

## 2016-07-31 MED ORDER — SENNOSIDES-DOCUSATE SODIUM 8.6-50 MG PO TABS: 1.0000 | ORAL_TABLET | Freq: Every evening | ORAL | Status: DC | PRN

## 2016-07-31 MED ORDER — SUFENTANIL CITRATE 50 MCG/ML IV SOLN
INTRAVENOUS | Status: DC | PRN
  Administered 2016-07-31: 20 ug via INTRAVENOUS
  Administered 2016-07-31: 10 ug via INTRAVENOUS
  Administered 2016-07-31: 20 ug via INTRAVENOUS

## 2016-07-31 MED ORDER — HYDROMORPHONE HCL 1 MG/ML IJ SOLN
0.2500 mg | INTRAMUSCULAR | Status: DC | PRN
  Administered 2016-07-31 (×2): 0.5 mg via INTRAVENOUS

## 2016-07-31 MED ORDER — BISACODYL 5 MG PO TBEC: 5.0000 mg | DELAYED_RELEASE_TABLET | Freq: Every day | ORAL | Status: DC | PRN

## 2016-07-31 MED ORDER — OXYCODONE-ACETAMINOPHEN 5-325 MG PO TABS
1.0000 | ORAL_TABLET | ORAL | Status: DC | PRN
  Administered 2016-07-31: 2 via ORAL
  Administered 2016-08-01 (×2): 1 via ORAL
  Filled 2016-07-31: qty 2
  Filled 2016-07-31 (×2): qty 1

## 2016-07-31 MED ORDER — PANTOPRAZOLE SODIUM 40 MG IV SOLR
40.0000 mg | Freq: Every day | INTRAVENOUS | Status: DC
  Administered 2016-07-31: 40 mg via INTRAVENOUS
  Filled 2016-07-31: qty 40

## 2016-07-31 MED ORDER — MINERAL OIL LIGHT 100 % EX OIL
TOPICAL_OIL | CUTANEOUS | Status: DC | PRN
  Administered 2016-07-31: 1 via TOPICAL

## 2016-07-31 MED ORDER — SUFENTANIL CITRATE 50 MCG/ML IV SOLN
INTRAVENOUS | Status: AC
  Filled 2016-07-31: qty 1

## 2016-07-31 MED ORDER — PHENYLEPHRINE 40 MCG/ML (10ML) SYRINGE FOR IV PUSH (FOR BLOOD PRESSURE SUPPORT)
PREFILLED_SYRINGE | INTRAVENOUS | Status: DC | PRN
  Administered 2016-07-31: 80 ug via INTRAVENOUS
  Administered 2016-07-31: 40 ug via INTRAVENOUS
  Administered 2016-07-31 (×3): 80 ug via INTRAVENOUS

## 2016-07-31 MED ORDER — LACTATED RINGERS IV SOLN
INTRAVENOUS | Status: DC
Start: 2016-07-31 — End: 2016-07-31
  Administered 2016-07-31 (×2): via INTRAVENOUS

## 2016-07-31 MED ORDER — BUPROPION HCL ER (XL) 150 MG PO TB24
150.0000 mg | ORAL_TABLET | Freq: Every day | ORAL | Status: DC
  Filled 2016-07-31: qty 1

## 2016-07-31 MED ORDER — SUGAMMADEX SODIUM 200 MG/2ML IV SOLN
INTRAVENOUS | Status: AC
  Filled 2016-07-31: qty 2

## 2016-07-31 MED ORDER — PHENYLEPHRINE 40 MCG/ML (10ML) SYRINGE FOR IV PUSH (FOR BLOOD PRESSURE SUPPORT)
PREFILLED_SYRINGE | INTRAVENOUS | Status: AC
  Filled 2016-07-31: qty 10

## 2016-07-31 MED ORDER — PROPOFOL 10 MG/ML IV BOLUS
INTRAVENOUS | Status: AC
  Filled 2016-07-31: qty 20

## 2016-07-31 MED ORDER — SODIUM CHLORIDE 0.9% FLUSH: 3.0000 mL | INTRAVENOUS | Status: DC | PRN

## 2016-07-31 MED ORDER — SODIUM CHLORIDE 0.9 % IV SOLN: 250.0000 mL | INTRAVENOUS | Status: DC

## 2016-07-31 MED ORDER — ACETAMINOPHEN 325 MG PO TABS: 650.0000 mg | ORAL_TABLET | ORAL | Status: DC | PRN

## 2016-07-31 MED ORDER — ROCURONIUM BROMIDE 10 MG/ML (PF) SYRINGE
PREFILLED_SYRINGE | INTRAVENOUS | Status: AC
  Filled 2016-07-31: qty 5

## 2016-07-31 MED ORDER — MOMETASONE FURO-FORMOTEROL FUM 100-5 MCG/ACT IN AERO
2.0000 | INHALATION_SPRAY | Freq: Two times a day (BID) | RESPIRATORY_TRACT | Status: DC
  Administered 2016-07-31: 2 via RESPIRATORY_TRACT
  Filled 2016-07-31: qty 8.8

## 2016-07-31 MED ORDER — THROMBIN 20000 UNITS EX KIT
PACK | CUTANEOUS | Status: DC | PRN
  Administered 2016-07-31: 20000 [IU] via TOPICAL

## 2016-07-31 MED ORDER — PROMETHAZINE HCL 25 MG/ML IJ SOLN: 6.2500 mg | INTRAMUSCULAR | Status: DC | PRN

## 2016-07-31 MED ORDER — MINERAL OIL LIGHT 100 % EX OIL
TOPICAL_OIL | CUTANEOUS | Status: AC
  Filled 2016-07-31: qty 25

## 2016-07-31 MED ORDER — SODIUM CHLORIDE 0.9 % IJ SOLN
INTRAMUSCULAR | Status: AC
  Filled 2016-07-31: qty 10

## 2016-07-31 SURGICAL SUPPLY — 75 items
BENZOIN TINCTURE PRP APPL 2/3 (GAUZE/BANDAGES/DRESSINGS) ×2 IMPLANT
BIT DRILL NEURO 2X3.1 SFT TUCH (MISCELLANEOUS) ×1 IMPLANT
BIT DRILL SRG 14X2.2XFLT CHK (BIT) ×1 IMPLANT
BIT DRL SRG 14X2.2XFLT CHK (BIT) ×1
BLADE CLIPPER SURG (BLADE) ×2 IMPLANT
BLADE SURG 15 STRL LF DISP TIS (BLADE) ×1 IMPLANT
BLADE SURG 15 STRL SS (BLADE) ×1
BONE VIVIGEN FORMABLE 1.3CC (Bone Implant) ×4 IMPLANT
CANISTER SUCT 3000ML PPV (MISCELLANEOUS) ×2 IMPLANT
CARTRIDGE OIL MAESTRO DRILL (MISCELLANEOUS) ×1 IMPLANT
CLSR STERI-STRIP ANTIMIC 1/2X4 (GAUZE/BANDAGES/DRESSINGS) ×2 IMPLANT
COVER SURGICAL LIGHT HANDLE (MISCELLANEOUS) ×2 IMPLANT
CRADLE DONUT ADULT HEAD (MISCELLANEOUS) ×2 IMPLANT
DERMABOND ADVANCED (GAUZE/BANDAGES/DRESSINGS) ×1
DERMABOND ADVANCED .7 DNX12 (GAUZE/BANDAGES/DRESSINGS) ×1 IMPLANT
DIFFUSER DRILL AIR PNEUMATIC (MISCELLANEOUS) ×2 IMPLANT
DRAIN JACKSON RD 7FR 3/32 (WOUND CARE) IMPLANT
DRAPE C-ARM 42X72 X-RAY (DRAPES) ×2 IMPLANT
DRAPE POUCH INSTRU U-SHP 10X18 (DRAPES) ×2 IMPLANT
DRAPE SURG 17X23 STRL (DRAPES) ×6 IMPLANT
DRILL BIT SKYLINE 14MM (BIT) ×1
DRILL NEURO 2X3.1 SOFT TOUCH (MISCELLANEOUS) ×2
DURAPREP 6ML APPLICATOR 50/CS (WOUND CARE) ×2 IMPLANT
ELECT COATED BLADE 2.86 ST (ELECTRODE) ×2 IMPLANT
ELECT REM PT RETURN 9FT ADLT (ELECTROSURGICAL) ×2
ELECTRODE REM PT RTRN 9FT ADLT (ELECTROSURGICAL) ×1 IMPLANT
EVACUATOR SILICONE 100CC (DRAIN) IMPLANT
GAUZE SPONGE 4X4 12PLY STRL (GAUZE/BANDAGES/DRESSINGS) ×2 IMPLANT
GAUZE SPONGE 4X4 12PLY STRL LF (GAUZE/BANDAGES/DRESSINGS) ×2 IMPLANT
GAUZE SPONGE 4X4 16PLY XRAY LF (GAUZE/BANDAGES/DRESSINGS) ×2 IMPLANT
GLOVE BIO SURGEON STRL SZ7 (GLOVE) ×8 IMPLANT
GLOVE BIO SURGEON STRL SZ8 (GLOVE) ×2 IMPLANT
GLOVE BIOGEL PI IND STRL 7.0 (GLOVE) ×4 IMPLANT
GLOVE BIOGEL PI IND STRL 8 (GLOVE) ×1 IMPLANT
GLOVE BIOGEL PI INDICATOR 7.0 (GLOVE) ×4
GLOVE BIOGEL PI INDICATOR 8 (GLOVE) ×1
GOWN STRL REUS W/ TWL LRG LVL3 (GOWN DISPOSABLE) ×1 IMPLANT
GOWN STRL REUS W/ TWL XL LVL3 (GOWN DISPOSABLE) ×1 IMPLANT
GOWN STRL REUS W/TWL LRG LVL3 (GOWN DISPOSABLE) ×1
GOWN STRL REUS W/TWL XL LVL3 (GOWN DISPOSABLE) ×1
INTERLOCK LRDTC CRVCL VBR 7MM (Bone Implant) ×2 IMPLANT
INTERLOCK LRDTC CRVCL VBR 8MM (Peek) ×1 IMPLANT
IV CATH 14GX2 1/4 (CATHETERS) ×2 IMPLANT
KIT BASIN OR (CUSTOM PROCEDURE TRAY) ×2 IMPLANT
KIT ROOM TURNOVER OR (KITS) ×2 IMPLANT
LORDOTIC CERVICAL VBR 7MM SM (Bone Implant) ×4 IMPLANT
LORDOTIC CERVICAL VBR 8MM SM (Peek) ×2 IMPLANT
NEEDLE PRECISIONGLIDE 27X1.5 (NEEDLE) ×2 IMPLANT
NEEDLE SPNL 20GX3.5 QUINCKE YW (NEEDLE) ×2 IMPLANT
NS IRRIG 1000ML POUR BTL (IV SOLUTION) ×2 IMPLANT
OIL CARTRIDGE MAESTRO DRILL (MISCELLANEOUS) ×2
PACK ORTHO CERVICAL (CUSTOM PROCEDURE TRAY) ×2 IMPLANT
PAD ARMBOARD 7.5X6 YLW CONV (MISCELLANEOUS) ×2 IMPLANT
PATTIES SURGICAL .5 X.5 (GAUZE/BANDAGES/DRESSINGS) IMPLANT
PATTIES SURGICAL .5 X1 (DISPOSABLE) IMPLANT
PIN DISTRACTION 14 (PIN) ×4 IMPLANT
PLATE SKYLINE THREE LEVEL 51MM (Plate) ×2 IMPLANT
SCREW SKYLINE VAR OS 14MM (Screw) ×16 IMPLANT
SPONGE INTESTINAL PEANUT (DISPOSABLE) ×2 IMPLANT
SPONGE SURGIFOAM ABS GEL 100 (HEMOSTASIS) IMPLANT
STRIP CLOSURE SKIN 1/2X4 (GAUZE/BANDAGES/DRESSINGS) ×2 IMPLANT
SURGIFLO W/THROMBIN 8M KIT (HEMOSTASIS) IMPLANT
SUT MNCRL AB 4-0 PS2 18 (SUTURE) ×2 IMPLANT
SUT SILK 4 0 (SUTURE)
SUT SILK 4-0 18XBRD TIE 12 (SUTURE) IMPLANT
SUT VIC AB 2-0 CT2 18 VCP726D (SUTURE) ×2 IMPLANT
SYR BULB IRRIGATION 50ML (SYRINGE) ×2 IMPLANT
SYR CONTROL 10ML LL (SYRINGE) ×2 IMPLANT
TAPE CLOTH 4X10 WHT NS (GAUZE/BANDAGES/DRESSINGS) ×2 IMPLANT
TAPE UMBILICAL COTTON 1/8X30 (MISCELLANEOUS) ×2 IMPLANT
TOWEL OR 17X24 6PK STRL BLUE (TOWEL DISPOSABLE) ×2 IMPLANT
TOWEL OR 17X26 10 PK STRL BLUE (TOWEL DISPOSABLE) ×2 IMPLANT
TRAY FOLEY W/METER SILVER 16FR (SET/KITS/TRAYS/PACK) ×2 IMPLANT
WATER STERILE IRR 1000ML POUR (IV SOLUTION) ×2 IMPLANT
YANKAUER SUCT BULB TIP NO VENT (SUCTIONS) ×2 IMPLANT

## 2016-07-31 NOTE — Anesthesia Procedure Notes (Signed)
Procedure Name: Intubation Date/Time: 07/31/2016 12:09 PM Performed by: Melina Copa, Zayla Agar R Pre-anesthesia Checklist: Patient identified, Emergency Drugs available, Suction available and Patient being monitored Patient Re-evaluated:Patient Re-evaluated prior to inductionOxygen Delivery Method: Circle System Utilized Preoxygenation: Pre-oxygenation with 100% oxygen Intubation Type: IV induction Ventilation: Mask ventilation without difficulty Laryngoscope Size: Mac and 4 Grade View: Grade II Tube type: Oral Tube size: 8.0 mm Number of attempts: 1 Airway Equipment and Method: Stylet Placement Confirmation: ETT inserted through vocal cords under direct vision,  positive ETCO2 and breath sounds checked- equal and bilateral Secured at: 23 cm Tube secured with: Tape Dental Injury: Teeth and Oropharynx as per pre-operative assessment

## 2016-07-31 NOTE — Anesthesia Preprocedure Evaluation (Addendum)
Anesthesia Evaluation  Patient identified by MRN, date of birth, ID band Patient awake    Reviewed: Allergy & Precautions, H&P , NPO status , Patient's Chart, lab work & pertinent test results  Airway Mallampati: II  TM Distance: >3 FB Neck ROM: Full    Dental no notable dental hx. (+) Teeth Intact, Dental Advisory Given   Pulmonary neg pulmonary ROS,    Pulmonary exam normal breath sounds clear to auscultation       Cardiovascular negative cardio ROS Normal cardiovascular exam Rhythm:Regular Rate:Normal     Neuro/Psych negative neurological ROS  negative psych ROS   GI/Hepatic Neg liver ROS, GERD  ,  Endo/Other  Hypothyroidism   Renal/GU negative Renal ROS     Musculoskeletal negative musculoskeletal ROS (+)   Abdominal (+) + obese,   Peds  Hematology negative hematology ROS (+)   Anesthesia Other Findings   Reproductive/Obstetrics negative OB ROS                            Anesthesia Physical  Anesthesia Plan  ASA: II  Anesthesia Plan: General   Post-op Pain Management:    Induction: Intravenous  PONV Risk Score and Plan: 3 and Ondansetron, Dexamethasone, Propofol and Midazolam  Airway Management Planned: Oral ETT  Additional Equipment:   Intra-op Plan:   Post-operative Plan: Extubation in OR  Informed Consent: I have reviewed the patients History and Physical, chart, labs and discussed the procedure including the risks, benefits and alternatives for the proposed anesthesia with the patient or authorized representative who has indicated his/her understanding and acceptance.   Dental advisory given  Plan Discussed with: CRNA, Anesthesiologist and Surgeon  Anesthesia Plan Comments:       Anesthesia Quick Evaluation

## 2016-07-31 NOTE — Transfer of Care (Signed)
Immediate Anesthesia Transfer of Care Note  Patient: Tyler ProphetJamie L Smith  Procedure(s) Performed: Procedure(s): ANTERIOR CERVICAL DECOMPRESSION/DISCECTOMY FUSION 3 LEVELS (N/A)  Patient Location: PACU  Anesthesia Type:General  Level of Consciousness: awake, oriented and patient cooperative  Airway & Oxygen Therapy: Patient Spontanous Breathing and Patient connected to nasal cannula oxygen  Post-op Assessment: Report given to RN, Post -op Vital signs reviewed and stable and Patient moving all extremities  Post vital signs: Reviewed and stable  Last Vitals:  Vitals:   07/31/16 1015  BP: (!) 162/102  Pulse: 83  Resp: 20  Temp: 36.6 C    Last Pain:  Vitals:   07/31/16 1015  TempSrc: Oral      Patients Stated Pain Goal: 2 (07/31/16 1038)  Complications: No apparent anesthesia complications

## 2016-08-01 ENCOUNTER — Encounter (HOSPITAL_COMMUNITY): Payer: Self-pay | Admitting: Orthopedic Surgery

## 2016-08-01 DIAGNOSIS — M4803 Spinal stenosis, cervicothoracic region: Secondary | ICD-10-CM | POA: Diagnosis not present

## 2016-08-01 MED ORDER — PANTOPRAZOLE SODIUM 40 MG PO TBEC
40.0000 mg | DELAYED_RELEASE_TABLET | Freq: Every day | ORAL | Status: DC
Start: 2016-08-01 — End: 2016-08-01

## 2016-08-01 MED FILL — Thrombin For Soln 20000 Unit: CUTANEOUS | Qty: 1 | Status: AC

## 2016-08-01 NOTE — Anesthesia Postprocedure Evaluation (Signed)
Anesthesia Post Note  Patient: Tyler Smith  Procedure(s) Performed: Procedure(s) (LRB): ANTERIOR CERVICAL DECOMPRESSION/DISCECTOMY FUSION 3 LEVELS (N/A)     Patient location during evaluation: PACU Anesthesia Type: General Level of consciousness: awake and alert Pain management: pain level controlled Vital Signs Assessment: post-procedure vital signs reviewed and stable Respiratory status: spontaneous breathing, nonlabored ventilation, respiratory function stable and patient connected to nasal cannula oxygen Cardiovascular status: blood pressure returned to baseline and stable Postop Assessment: no signs of nausea or vomiting Anesthetic complications: no    Last Vitals:  Vitals:   08/01/16 0011 08/01/16 0411  BP: (!) 144/83 (!) 144/90  Pulse: (!) 109 (!) 103  Resp: 20 20  Temp: 37.1 C 36.9 C    Last Pain:  Vitals:   08/01/16 0618  TempSrc:   PainSc: 5                  Kennieth RadFitzgerald, Akia Montalban E

## 2016-08-01 NOTE — Op Note (Deleted)
  The note originally documented on this encounter has been moved the the encounter in which it belongs.  

## 2016-08-01 NOTE — Op Note (Signed)
NAMEYEHONATAN, GRANDISON NO.:  [id number]  MEDICAL RECORD NO.:  [id number]  LOCATION:                               FACILITY:  [facility]  PHYSICIAN:  Estill Bamberg, MD      DATE OF BIRTH:  April 15, 1960  DATE OF PROCEDURE:  07/31/2016                              OPERATIVE REPORT   PREOPERATIVE DIAGNOSES: 1. Right-sided cervical radiculopathy. 2. Neuroforaminal stenosis, C6-7, C7-T1, with spinal cord compression     noted at C5-6.  POSTOPERATIVE DIAGNOSES: 1. Right-sided cervical radiculopathy. 2. Neuroforaminal stenosis, C6-7, C7-T1, with spinal cord compression     noted at C5-6.  PROCEDURES: 1. Anterior cervical decompression and fusion, C5-6, C6-7, C7-T1. 2. Placement of anterior instrumentation, C5, C6, C7, T1 3. Use of morselized allograft - ViviGen. 4. Insertion of interbody device x3 (Titan intervertebral spacers). 5. Intraoperative use of fluoroscopy.  SURGEON:  Estill Bamberg, MD  ASSISTANT:  Jason Coop, PA-C.  ANESTHESIA:  General endotracheal anesthesia.  COMPLICATIONS:  None.  DISPOSITION:  Stable.  ESTIMATED BLOOD LOSS:  Minimal.  INDICATIONS FOR SURGERY:  Briefly, Mr. Tyler Smith is a very pleasant 56 year old male, who did present to me with ongoing and rather debilitating pain in his right arm.  The patient's MRI did reveal the findings outlined above.  Given the patient's ongoing pain and dysfunction, we did discuss proceeding with the procedure reflected above.  The patient was fully aware of the risks and limitations of surgery as outlined in my preoperative note.  OPERATIVE DETAILS:  On July 31, 2016, the patient was brought to Surgery and general endotracheal anesthesia was administered.  The patient was placed supine on an operating room table.  The neck was gently extended. The patient's arms were secured to his sides.  The neck was prepped and draped in the usual fashion.  A left-sided transverse incision was then made.  The  platysma was incised.  A Smith-Robinson approach was used in the vertebral bodies at C5, C6, C7, and T1 were identified and subperiosteally exposed.  A self-retaining retractor was placed.  Caspar pins were then placed into the C7 and T1 vertebral bodies and distraction was applied.  A thorough and complete C7-T1 intervertebral diskectomy was performed.  The spinal canal and right neural foramen were thoroughly decompressed.  The endplates were prepared and the appropriate-sized interbody spacer was packed with DBX Mix and tamped into position in the usual fashion.  The lower Caspar pin was removed and bone wax was placed in its place.  New Caspar pin was placed into the C6 vertebral body and distraction was then applied across the C6-7 intervertebral space.  Once again, a diskectomy was performed and the spinal canal and right and left neural foramina were decompressed.  The endplates were prepared and the appropriate-sized interbody spacer was packed with ViviGen and tamped into position.  The lower Caspar pin was removed.  Then, a new Caspar pin was placed into the C5 vertebral body. Distraction was then applied across the C5-6 intervertebral space.  Once again, a diskectomy was performed and a decompression of the spinal canal and right and left neural foramina were performed as well.  After preparing the endplates, the appropriate-sized interbody spacer was packed with DBX Mix and tamped into position.  The appropriate-sized anterior cervical plate was placed over the anterior spine.  The 14-mm screws were then placed, two in each vertebral body at C5, C6, C7, and T1 for a total of eight vertebral body screws.  The screws were then locked to the plate using the Cam locking mechanism.  I was very pleased with the final appearance on the fluoroscopic images.  The wound was then irrigated.  The wound was then closed in layers using 2-0 Vicryl followed by 4-0 Monocryl.  Benzoin and  Steri-Strips were applied followed by sterile dressing.  All instrument counts were correct at the termination of the procedure.  Of note, Jason CoopKayla McKenzie was my assistant throughout the surgery, and did aid in retraction, suctioning, and closure.     Estill BambergMark Leroy Pettway, MD   ______________________________ Estill BambergMark Breshae Belcher, MD    MD/MEDQ  D:  07/31/2016  T:  08/01/2016  Job:  [id number]  cc:   L. Lupe Carneyean Mitchell, M.D.

## 2016-08-01 NOTE — Care Management Note (Signed)
Case Management Note  Patient Details  Name: Tyler Smith MRN: [id number] Date of Birth: 12/02/1960  Subjective/Objective:     CM following for progression and d/c planning.                Action/Plan: No HH or DME needs identified. Noted pt for d/c to home today.   Expected Discharge Date:  08/01/16               Expected Discharge Plan:  Home/Self Care  In-House Referral:  NA  Discharge planning Services  NA  Post Acute Care Choice:  NA Choice offered to:  NA  DME Arranged:   NA DME Agency:   NA  HH Arranged:   NA HH Agency:   NA  Status of Service:  Completed, signed off  If discussed at Long Length of Stay Meetings, dates discussed:    Additional Comments:  [name], [name], RN 08/01/2016, 12:57 PM

## 2016-08-01 NOTE — Progress Notes (Signed)
Patient alert and oriented, mae's well, voiding adequate amount of urine, swallowing without difficulty, no c/o pain at time of discharge. Patient discharged home with family. Script and discharged instructions given to patient. Patient and family stated understanding of instructions given. Patient has an appointment with Dr. Dumonski 

## 2016-08-01 NOTE — Progress Notes (Signed)
    Patient doing well Minimal neck pain Has been ambulating and eating without difficulty Patient reports resolved R arm pain   Physical Exam: Vitals:   08/01/16 0011 08/01/16 0411  BP: (!) 144/83 (!) 144/90  Pulse: (!) 109 (!) 103  Resp: 20 20  Temp: 98.7 F (37.1 C) 98.4 F (36.9 C)   Neck soft/supple Dressing in place NVI  POD #1 s/p C5-T1 ACDF, doing well  - encourage ambulation - Percocet for pain, Valium for muscle spasms - d/c home later today with f/u in 2 weeks

## 2016-08-08 NOTE — Discharge Summary (Signed)
Patient ID: Tyler Smith MRN: [id number] DOB/AGE: 1960/02/28 56 y.o.  Admit date: 07/31/2016 Discharge date: 08/01/2016  Admission Diagnoses:  Active Problems:   Radiculopathy   Discharge Diagnoses:  Same  Past Medical History:  Diagnosis Date  . GERD (gastroesophageal reflux disease)    none recent  . Hypercholesterolemia   . Hypothyroidism   . Pancreatic cyst   . Thyroid disease     Surgeries: Procedure(s): ANTERIOR CERVICAL DECOMPRESSION/DISCECTOMY FUSION 3 LEVELS on 07/31/2016   Consultants: None  Discharged Condition: Improved  Hospital Course: Tyler Smith is an 56 y.o. male who was admitted 07/31/2016 for operative treatment of radiculopathy. Patient has severe unremitting pain that affects sleep, daily activities, and work/hobbies. After pre-op clearance the patient was taken to the operating room on 07/31/2016 and underwent  Procedure(s): ANTERIOR CERVICAL DECOMPRESSION/DISCECTOMY FUSION 3 LEVELS.    Patient was given perioperative antibiotics:  Anti-infectives    Start     Dose/Rate Route Frequency Ordered Stop   08/01/16 0000  vancomycin (VANCOCIN) 1,500 mg in sodium chloride 0.9 % 500 mL IVPB     1,500 mg 250 mL/hr over 120 Minutes Intravenous  Once 07/31/16 1844 08/01/16 0132   07/31/16 1200  vancomycin (VANCOCIN) 1,500 mg in sodium chloride 0.9 % 500 mL IVPB     1,500 mg 250 mL/hr over 120 Minutes Intravenous On call to O.R. 07/30/16 1011 07/31/16 1320       Patient was given sequential compression devices, early ambulation to prevent DVT.  Patient benefited maximally from hospital stay and there were no complications.    Recent vital signs: BP (!) 150/85 (BP Location: Left Arm)   Pulse 93   Temp 98.8 F (37.1 C) (Oral)   Resp 20   Ht 5\' 11"  (1.803 m)   Wt 102.1 kg (225 lb)   SpO2 95%   BMI 31.38 kg/m   Discharge Medications:   Allergies as of 08/01/2016      Reactions   Penicillins Hives      Medication List    STOP taking these  medications   acetaminophen 325 MG tablet Commonly known as:  TYLENOL     TAKE these medications   buPROPion 150 MG 24 hr tablet Commonly known as:  WELLBUTRIN XL Take 150 mg by mouth daily.   levothyroxine 137 MCG tablet Commonly known as:  SYNTHROID, LEVOTHROID Take 137 mcg by mouth daily before breakfast.   SYMBICORT 80-4.5 MCG/ACT inhaler Generic drug:  budesonide-formoterol Inhale 2 puffs into the lungs 2 (two) times daily.   Turmeric 500 MG Caps Take 1 capsule by mouth daily.       Diagnostic Studies: Dg Chest 2 View  Result Date: 07/26/2016 CLINICAL DATA:  Preoperative examination prior to cervical spine surgery. No current chest complaints. History of hypercholesterolemia, nonsmoker. EXAM: CHEST  2 VIEW COMPARISON:  Chest x-ray of January 07, 2014 FINDINGS: The lungs are adequately inflated and clear. The heart and pulmonary vascularity are normal. The mediastinum is normal in width. There is no pleural effusion. The bony thorax exhibits no acute abnormality. IMPRESSION: There is no pneumonia, CHF, nor other acute cardiopulmonary abnormality. Electronically Signed   By: David  [place] M.D.   On: 07/26/2016 15:34   Dg Cervical Spine 1 View  Result Date: 07/31/2016 CLINICAL DATA:  Cervical spine surgery. EXAM: DG C-ARM 61-120 MIN; DG CERVICAL SPINE - 1 VIEW COMPARISON:  None. FLUOROSCOPY TIME:  C-arm fluoroscopic images were obtained intraoperatively and submitted for post operative interpretation. Please  see the performing provider's procedural report for the fluoroscopy time utilized. FINDINGS: A single lateral intraoperative spot fluoroscopic image of the cervical spine is provided. An anterior fusion plate and screws extent from C5 to likely the T1 level, though the upper thoracic spine is poorly visualized. Interbody implants are present at C5-6, C6-7, and C7-T1. Endotracheal and enteric tubes are partially visualized. IMPRESSION: Intraoperative images during anterior  cervical fusion as above. Electronically Signed   By: Sebastian AcheAllen  Grady M.D.   On: 07/31/2016 16:17   Dg C-arm 1-60 Min  Result Date: 07/31/2016 CLINICAL DATA:  Cervical spine surgery. EXAM: DG C-ARM 61-120 MIN; DG CERVICAL SPINE - 1 VIEW COMPARISON:  None. FLUOROSCOPY TIME:  C-arm fluoroscopic images were obtained intraoperatively and submitted for post operative interpretation. Please see the performing provider's procedural report for the fluoroscopy time utilized. FINDINGS: A single lateral intraoperative spot fluoroscopic image of the cervical spine is provided. An anterior fusion plate and screws extent from C5 to likely the T1 level, though the upper thoracic spine is poorly visualized. Interbody implants are present at C5-6, C6-7, and C7-T1. Endotracheal and enteric tubes are partially visualized. IMPRESSION: Intraoperative images during anterior cervical fusion as above. Electronically Signed   By: Sebastian AcheAllen  Grady M.D.   On: 07/31/2016 16:17    Disposition: 01-Home or Self Care   POD #1 s/p C5-T1 ACDF, doing well  -Written scripts for pain signed and in chart -D/C instructions sheet printed and in chart -D/C today  -F/U in office 2 weeks   Signed: Georga BoraMCKENZIE, Venda Dice J 08/08/2016, 8:36 AM

## 2016-08-21 DIAGNOSIS — G9529 Other cord compression: Secondary | ICD-10-CM | POA: Diagnosis not present

## 2016-09-17 DIAGNOSIS — M5412 Radiculopathy, cervical region: Secondary | ICD-10-CM | POA: Diagnosis not present

## 2016-09-23 DIAGNOSIS — M542 Cervicalgia: Secondary | ICD-10-CM | POA: Diagnosis not present

## 2016-09-23 DIAGNOSIS — M4322 Fusion of spine, cervical region: Secondary | ICD-10-CM | POA: Diagnosis not present

## 2016-10-29 DIAGNOSIS — Z9889 Other specified postprocedural states: Secondary | ICD-10-CM | POA: Diagnosis not present

## 2016-10-29 DIAGNOSIS — M4322 Fusion of spine, cervical region: Secondary | ICD-10-CM | POA: Diagnosis not present

## 2016-11-13 DIAGNOSIS — Z0000 Encounter for general adult medical examination without abnormal findings: Secondary | ICD-10-CM | POA: Diagnosis not present

## 2016-11-13 DIAGNOSIS — E7800 Pure hypercholesterolemia, unspecified: Secondary | ICD-10-CM | POA: Diagnosis not present

## 2016-11-30 DIAGNOSIS — Z23 Encounter for immunization: Secondary | ICD-10-CM | POA: Diagnosis not present

## 2017-01-28 DIAGNOSIS — E039 Hypothyroidism, unspecified: Secondary | ICD-10-CM | POA: Diagnosis not present

## 2017-09-01 DIAGNOSIS — H00013 Hordeolum externum right eye, unspecified eyelid: Secondary | ICD-10-CM | POA: Diagnosis not present

## 2017-12-11 DIAGNOSIS — E7800 Pure hypercholesterolemia, unspecified: Secondary | ICD-10-CM | POA: Diagnosis not present

## 2017-12-11 DIAGNOSIS — E039 Hypothyroidism, unspecified: Secondary | ICD-10-CM | POA: Diagnosis not present

## 2017-12-11 DIAGNOSIS — Z1159 Encounter for screening for other viral diseases: Secondary | ICD-10-CM | POA: Diagnosis not present

## 2017-12-11 DIAGNOSIS — Z0000 Encounter for general adult medical examination without abnormal findings: Secondary | ICD-10-CM | POA: Diagnosis not present

## 2017-12-11 DIAGNOSIS — Z23 Encounter for immunization: Secondary | ICD-10-CM | POA: Diagnosis not present

## 2018-01-19 ENCOUNTER — Ambulatory Visit: Payer: 59 | Admitting: Cardiology

## 2018-01-19 ENCOUNTER — Encounter: Payer: Self-pay | Admitting: Cardiology

## 2018-01-19 VITALS — BP 158/100 | HR 88 | Ht 71.0 in | Wt 225.0 lb

## 2018-01-19 DIAGNOSIS — Z7189 Other specified counseling: Secondary | ICD-10-CM

## 2018-01-19 DIAGNOSIS — E782 Mixed hyperlipidemia: Secondary | ICD-10-CM

## 2018-01-19 DIAGNOSIS — Z8249 Family history of ischemic heart disease and other diseases of the circulatory system: Secondary | ICD-10-CM

## 2018-01-19 NOTE — Patient Instructions (Addendum)
Mediterranean Diet A Mediterranean diet refers to food and lifestyle choices that are based on the traditions of countries located on the [place]. This way of eating has been shown to help prevent certain conditions and improve outcomes for people who have chronic diseases, like kidney disease and heart disease. What are tips for following this plan? Lifestyle  Cook and eat meals together with your family, when possible.  Drink enough fluid to keep your urine clear or pale yellow.  Be physically active every day. This includes: ? Aerobic exercise like running or swimming. ? Leisure activities like gardening, walking, or housework.  Get 7-8 hours of sleep each night.  If recommended by your health care provider, drink red wine in moderation. This means 1 glass a day for nonpregnant women and 2 glasses a day for men. A glass of wine equals 5 oz (150 mL). Reading food labels  Check the serving size of packaged foods. For foods such as rice and pasta, the serving size refers to the amount of cooked product, not dry.  Check the total fat in packaged foods. Avoid foods that have saturated fat or trans fats.  Check the ingredients list for added sugars, such as corn syrup. Shopping  At the grocery store, buy most of your food from the areas near the walls of the store. This includes: ? Fresh fruits and vegetables (produce). ? Grains, beans, nuts, and seeds. Some of these may be available in unpackaged forms or large amounts (in bulk). ? Fresh seafood. ? Poultry and eggs. ? Low-fat dairy products.  Buy whole ingredients instead of prepackaged foods.  Buy fresh fruits and vegetables in-season from local farmers markets.  Buy frozen fruits and vegetables in resealable bags.  If you do not have access to quality fresh seafood, buy precooked frozen shrimp or canned fish, such as tuna, salmon, or sardines.  Buy small amounts of raw or cooked vegetables, salads, or olives from the  deli or salad bar at your store.  Stock your pantry so you always have certain foods on hand, such as olive oil, canned tuna, canned tomatoes, rice, pasta, and beans. Cooking  Cook foods with extra-virgin olive oil instead of using butter or other vegetable oils.  Have meat as a side dish, and have vegetables or grains as your main dish. This means having meat in small portions or adding small amounts of meat to foods like pasta or stew.  Use beans or vegetables instead of meat in common dishes like chili or lasagna.  Experiment with different cooking methods. Try roasting or broiling vegetables instead of steaming or sauteing them.  Add frozen vegetables to soups, stews, pasta, or rice.  Add nuts or seeds for added healthy fat at each meal. You can add these to yogurt, salads, or vegetable dishes.  Marinate fish or vegetables using olive oil, lemon juice, garlic, and fresh herbs. Meal planning  Plan to eat 1 vegetarian meal one day each week. Try to work up to 2 vegetarian meals, if possible.  Eat seafood 2 or more times a week.  Have healthy snacks readily available, such as: ? Vegetable sticks with hummus. ? Greek yogurt. ? Fruit and nut trail mix.  Eat balanced meals throughout the week. This includes: ? Fruit: 2-3 servings a day ? Vegetables: 4-5 servings a day ? Low-fat dairy: 2 servings a day ? Fish, poultry, or lean meat: 1 serving a day ? Beans and legumes: 2 or more servings a week ? Nuts   and seeds: 1-2 servings a day ? Whole grains: 6-8 servings a day ? Extra-virgin olive oil: 3-4 servings a day  Limit red meat and sweets to only a few servings a month What are my food choices?  Mediterranean diet ? Recommended ? Grains: Whole-grain pasta. Brown rice. Bulgar wheat. Polenta. Couscous. Whole-wheat bread. Orpah Cobbatmeal. Quinoa. ? Vegetables: Artichokes. Beets. Broccoli. Cabbage. Carrots. Eggplant. Green beans. Chard. Kale. Spinach. Onions. Leeks. Peas. Squash.  Tomatoes. Peppers. Radishes. ? Fruits: Apples. Apricots. Avocado. Berries. Bananas. Cherries. Dates. Figs. Grapes. Lemons. Melon. Oranges. Peaches. Plums. Pomegranate. ? Meats and other protein foods: Beans. Almonds. Sunflower seeds. Pine nuts. Peanuts. Cod. Salmon. Scallops. Shrimp. Tuna. Tilapia. Clams. Oysters. Eggs. ? Dairy: Low-fat milk. Cheese. Greek yogurt. ? Beverages: Water. Red wine. Herbal tea. ? Fats and oils: Extra virgin olive oil. Avocado oil. Grape seed oil. ? Sweets and desserts: [place] yogurt with honey. Baked apples. Poached pears. Trail mix. ? Seasoning and other foods: Basil. Cilantro. Coriander. Cumin. Mint. Parsley. Sage. Rosemary. Tarragon. Garlic. Oregano. Thyme. Pepper. Balsalmic vinegar. Tahini. Hummus. Tomato sauce. Olives. Mushrooms. ? Limit these ? Grains: Prepackaged pasta or rice dishes. Prepackaged cereal with added sugar. ? Vegetables: Deep fried potatoes (french fries). ? Fruits: Fruit canned in syrup. ? Meats and other protein foods: Beef. Pork. Lamb. Poultry with skin. Hot dogs. Tomasa BlaseBacon. ? Dairy: Ice cream. Sour cream. Whole milk. ? Beverages: Juice. Sugar-sweetened soft drinks. Beer. Liquor and spirits. ? Fats and oils: Butter. Canola oil. Vegetable oil. Beef fat (tallow). Lard. ? Sweets and desserts: Cookies. Cakes. Pies. Candy. ? Seasoning and other foods: Mayonnaise. Premade sauces and marinades. ? The items listed may not be a complete list. Talk with your dietitian about what dietary choices are right for you. Summary  The Mediterranean diet includes both food and lifestyle choices.  Eat a variety of fresh fruits and vegetables, beans, nuts, seeds, and whole grains.  Limit the amount of red meat and sweets that you eat.  Talk with your health care provider about whether it is safe for you to drink red wine in moderation. This means 1 glass a day for nonpregnant women and 2 glasses a day for men. A glass of wine equals 5 oz (150 mL). This information  is not intended to replace advice given to you by your health care provider. Make sure you discuss any questions you have with your health care provider. Document Released: 09/21/2015 Document Revised: 10/24/2015 Document Reviewed: 09/21/2015 Elsevier Interactive Patient Education  2018 [employer]     Medication Instructions:  Continue same medications If you need a refill on your cardiac medications before your next appointment, please call your pharmacy.   Lab work: Have Lab in January 9 lipid panel,crp,lipoprotein a ) Lab opens Mon-Fri  8:00 am to 12:00 noon  No Appointment needed Nothing to eat or drink after midnight   Testing/Procedures: Schedule Cardiac Ct Calcium Score in 02/2018  Follow-Up: At [facility], you and your health needs are our priority.  As part of our continuing mission to provide you with exceptional heart care, we have created designated Provider Care Teams.  These Care Teams include your primary Cardiologist (physician) and Advanced Practice Providers (APPs -  Physician Assistants and Nurse Practitioners) who all work together to provide you with the care you need, when you need it. . Follow Up with Dr.Lundyn Coste in 1 year  Call 2 months before to schedule

## 2018-01-19 NOTE — Progress Notes (Signed)
Cardiology Office Note:    Date:  01/19/2018   ID:  Tyler Smith, DOB Jul 10, 1960, MRN [id number]  PCP:  Asencion Gowda.August Saucer, MD  Cardiologist:  Jodelle Red, MD PhD  Referring MD: Asencion Gowda.August Saucer, MD   CC: establish care, cardiovascular risk assessment  History of Present Illness:    Tyler Smith is a 57 y.o. male with a hx of hypothyroidism, hyperlipidemia, family history of CAD who is seen as a new consult at the request of Clovis Riley, L.August Saucer, MD for the evaluation and management of cardiovascular risk and prevention.  Per notes received from Dr. Clovis Riley, he had two brothers who had CAD in their 78s and passed away. He is on pravastatin, most recent lipid panel HDL 62, LDL 110, Tchol 190, TG 90.   Patient concerns: wants to repeat stress test or do additional testing to determine what his risk is.   Cardiovascular risk factors: Prior clinical ASCVD: none Comorbid conditions, including hypertension, hyperlipidemia, diabetes, chronic kidney disease: hyperlipidemia. Last 126/84 at PCP. Metabolic syndrome/Obesity: BMi 31 Chronic inflammatory conditions: none Tobacco use history: none Family history: two brothers with multiple heart attacks, both deceased. One brother passed away from surgery complications, one passed from heart disease. Initial heart attacks in their mid 50s. Both smokers, both had been heavy drinkers in the past. Mother had unknown heart disease, passed from another cause.Unsure of dad's history as he was not present. Also has 2 sisters in good health. Hs a 56 year old daughter, in good health. Prior cardiac testing and/or incidental findings on other testing (ie coronary calcium): Negative ETT in 2013, records sent from [facility]. 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 Rare PVCs, otherwise normal.  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 No routine/intentiional activity. Just joined a gym Current  diet: tries to watch intake, small meals, no restrictions Alcohol: average 4-5 drinks/week Symptoms: no chest pain, no shortness of breath. Can climb stairs, walks the trail. No syncope, palpitations, PND, orthopnea, LE edema.  Past Medical History:  Diagnosis Date  . GERD (gastroesophageal reflux disease)    none recent  . Hypercholesterolemia   . Hypothyroidism   . Pancreatic cyst   . Thyroid disease     Past Surgical History:  Procedure Laterality Date  . ANTERIOR CERVICAL DECOMP/DISCECTOMY FUSION N/A 07/31/2016   Procedure: ANTERIOR CERVICAL DECOMPRESSION/DISCECTOMY FUSION 3 LEVELS;  Surgeon: Estill Bamberg, MD;  Location: [facility] OR;  Service: Orthopedics;  Laterality: N/A;  . EUS N/A 09/01/2013   Procedure: ESOPHAGEAL ENDOSCOPIC ULTRASOUND (EUS) RADIAL;  Surgeon: Willis Modena, MD;  Location: [facility];  Service: Endoscopy;  Laterality: N/A;  . EYE SURGERY Bilateral age 85   lazy eye  . FINE NEEDLE ASPIRATION N/A 09/01/2013   Procedure: FINE NEEDLE ASPIRATION (FNA) LINEAR;  Surgeon: Willis Modena, MD;  Location: [facility];  Service: Endoscopy;  Laterality: N/A;  . pinkie finger surgfery Right yrs ago    Current Medications: Current Outpatient Medications on File Prior to Visit  Medication Sig  . buPROPion (WELLBUTRIN XL) 300 MG 24 hr tablet Take 300 mg by mouth daily.  Marland Kitchen levothyroxine (SYNTHROID, LEVOTHROID) 150 MCG tablet Take 150 mcg by mouth daily.  . pravastatin (PRAVACHOL) 40 MG tablet Take 40 mg by mouth daily.  . SYMBICORT 80-4.5 MCG/ACT inhaler Inhale 2 puffs into the lungs 2 (two) times daily.  . Turmeric 500 MG CAPS Take 1 capsule by mouth daily.   No current facility-administered medications  on file prior to visit.      Allergies:   Penicillins   Social History   Socioeconomic History  . Marital status: Married    Spouse name: Not on file  . Number of children: Not on file  . Years of education: Not on file  . Highest education level: Not on file    Occupational History  . Not on file  Social Needs  . Financial resource strain: Not on file  . Food insecurity:    Worry: Not on file    Inability: Not on file  . Transportation needs:    Medical: Not on file    Non-medical: Not on file  Tobacco Use  . Smoking status: Never Smoker  . Smokeless tobacco: Never Used  Substance and Sexual Activity  . Alcohol use: Yes    Comment: occasional  . Drug use: No  . Sexual activity: Yes  Lifestyle  . Physical activity:    Days per week: Not on file    Minutes per session: Not on file  . Stress: Not on file  Relationships  . Social connections:    Talks on phone: Not on file    Gets together: Not on file    Attends religious service: Not on file    Active member of club or organization: Not on file    Attends meetings of clubs or organizations: Not on file    Relationship status: Not on file  Other Topics Concern  . Not on file  Social History Narrative  . Not on file     Family History: The patient's family history includes COPD in his father; Cancer - Other in his mother; Coronary artery disease in his brother and brother.  two brothers with multiple heart attacks, both deceased. One brother passed away from surgery complications, one passed from heart disease. Initial heart attacks in their mid 50s. Both smokers, both had been heavy drinkers in the past. Mother had unknown heart disease, passed from another cause.Unsure of dad's history as he was not present. Also has 2 sisters in good health. Hs a 489 year old daughter, in good health.  ROS:   Please see the history of present illness.  Additional pertinent ROS:  Constitutional: Negative for chills, fever, night sweats, unintentional weight loss  HENT: Negative for ear pain and hearing loss.   Eyes: Negative for loss of vision and eye pain.  Respiratory: Negative for cough, sputum, shortness of breath, wheezing.   Cardiovascular: Negative for chest pain, palpitations, PND,  orthopnea, lower extremity edema and claudication.  Gastrointestinal: Negative for abdominal pain, melena, and hematochezia.  Genitourinary: Negative for dysuria and hematuria.  Musculoskeletal: Negative for falls and myalgias.  Skin: Negative for itching and rash.  Neurological: Negative for focal weakness, focal sensory changes and loss of consciousness.  Endo/Heme/Allergies: Does not bruise/bleed easily.    EKGs/Labs/Other Studies Reviewed:    The following studies were reviewed today: Records from Dr. Clovis RileyMitchell, including prior ETT   EKG:  EKG is personally reviewed.  The ekg ordered today demonstrates normal sinus rhythm  Recent Labs: No results found for requested labs within last 8760 hours.  Recent Lipid Panel No results found for: CHOL, TRIG, HDL, CHOLHDL, VLDL, LDLCALC, LDLDIRECT  Physical Exam:    VS:  BP (!) 158/100 (BP Location: Right Arm, Patient Position: Sitting, Cuff Size: Large)   Pulse 88   Ht 5\' 11"  (1.803 m)   Wt 225 lb (102.1 kg)   BMI 31.38 kg/m  Wt Readings from Last 3 Encounters:  01/19/18 225 lb (102.1 kg)  07/31/16 225 lb (102.1 kg)  07/26/16 225 lb 6.4 oz (102.2 kg)    Recent BP at Dr. Quita Skye office 126/84  GEN: Well nourished, well developed in no acute distress HEENT: Normal NECK: No JVD; No carotid bruits LYMPHATICS: No lymphadenopathy CARDIAC: regular rhythm, normal S1 and S2, no murmurs, rubs, gallops. Radial and DP pulses 2+ bilaterally. RESPIRATORY:  Clear to auscultation without rales, wheezing or rhonchi  ABDOMEN: Soft, non-tender, non-distended MUSCULOSKELETAL:  No edema; No deformity  SKIN: Warm and dry NEUROLOGIC:  Alert and oriented x 3 PSYCHIATRIC:  Normal affect   ASSESSMENT:    1. Family history of early CAD   2. Mixed hyperlipidemia   3. Encounter for cardiac risk counseling   4. Counseling on health promotion and disease prevention    PLAN:    1. CV risk: hyperlipidemia, FH of significant CAD. Counseled on  current guidelines and recommendation for risk assessment. We discussed options at length, including ct coronary calcium score, lp(a) and hs-CRP. We also reviewed recommendations re: stress tests in asymptomatic individuals.   -he will pursue coronary calcium score and further blood testing. Will order lipid and lp(a) to be drawn at same time, ordered hs-CRP as well  -already on pravastatin, which is appropriate based on his calculated ASCVD risk. However, I would like to be more aggressive if he is high risk given his family history. The above information will further risk stratify him.   2. Primary prevention -recommend heart healthy/Mediterranean diet, with whole grains, fruits, vegetable, fish, lean meats, nuts, and olive oil. Limit salt. -recommend moderate walking, 3-5 times/week for 30-50 minutes each session. Aim for at least 150 minutes.week. Goal should be pace of 3 miles/hours, or walking 1.5 miles in 30 minutes -recommend avoidance of tobacco products. Avoid excess alcohol. -Additional risk factor control:  -Diabetes: A1c is not available, denies history  -Lipids: HDL 62, LDL 110, Tchol 190, TG 90  -Blood pressure control: elevated today, but reports good control usually. Last was 126/84 at PCP. Monitor.  -Weight: BMI 31.4, discussed lifestyle for weight management -ASCVD risk score: 4.3%  Plan for follow up: 1 year or sooner PRN based on results of testing  Medication Adjustments/Labs and Tests Ordered: Current medicines are reviewed at length with the patient today.  Concerns regarding medicines are outlined above.  Orders Placed This Encounter  Procedures  . CT CARDIAC SCORING  . Lipid Panel With LDL/HDL Ratio  . CRP High sensitivity  . Lipoprotein A (LPA)  . EKG 12-Lead   No orders of the defined types were placed in this encounter.   Patient Instructions  Mediterranean Diet A Mediterranean diet refers to food and lifestyle choices that are based on the traditions of  countries located on the [place]. This way of eating has been shown to help prevent certain conditions and improve outcomes for people who have chronic diseases, like kidney disease and heart disease. What are tips for following this plan? Lifestyle  Cook and eat meals together with your family, when possible.  Drink enough fluid to keep your urine clear or pale yellow.  Be physically active every day. This includes: ? Aerobic exercise like running or swimming. ? Leisure activities like gardening, walking, or housework.  Get 7-8 hours of sleep each night.  If recommended by your health care provider, drink red wine in moderation. This means 1 glass a day for nonpregnant women and 2 glasses  a day for men. A glass of wine equals 5 oz (150 mL). Reading food labels  Check the serving size of packaged foods. For foods such as rice and pasta, the serving size refers to the amount of cooked product, not dry.  Check the total fat in packaged foods. Avoid foods that have saturated fat or trans fats.  Check the ingredients list for added sugars, such as corn syrup. Shopping  At the grocery store, buy most of your food from the areas near the walls of the store. This includes: ? Fresh fruits and vegetables (produce). ? Grains, beans, nuts, and seeds. Some of these may be available in unpackaged forms or large amounts (in bulk). ? Fresh seafood. ? Poultry and eggs. ? Low-fat dairy products.  Buy whole ingredients instead of prepackaged foods.  Buy fresh fruits and vegetables in-season from local farmers markets.  Buy frozen fruits and vegetables in resealable bags.  If you do not have access to quality fresh seafood, buy precooked frozen shrimp or canned fish, such as tuna, salmon, or sardines.  Buy small amounts of raw or cooked vegetables, salads, or olives from the deli or salad bar at your store.  Stock your pantry so you always have certain foods on hand, such as olive  oil, canned tuna, canned tomatoes, rice, pasta, and beans. Cooking  Cook foods with extra-virgin olive oil instead of using butter or other vegetable oils.  Have meat as a side dish, and have vegetables or grains as your main dish. This means having meat in small portions or adding small amounts of meat to foods like pasta or stew.  Use beans or vegetables instead of meat in common dishes like chili or lasagna.  Experiment with different cooking methods. Try roasting or broiling vegetables instead of steaming or sauteing them.  Add frozen vegetables to soups, stews, pasta, or rice.  Add nuts or seeds for added healthy fat at each meal. You can add these to yogurt, salads, or vegetable dishes.  Marinate fish or vegetables using olive oil, lemon juice, garlic, and fresh herbs. Meal planning  Plan to eat 1 vegetarian meal one day each week. Try to work up to 2 vegetarian meals, if possible.  Eat seafood 2 or more times a week.  Have healthy snacks readily available, such as: ? Vegetable sticks with hummus. ? [place] yogurt. ? Fruit and nut trail mix.  Eat balanced meals throughout the week. This includes: ? Fruit: 2-3 servings a day ? Vegetables: 4-5 servings a day ? Low-fat dairy: 2 servings a day ? Fish, poultry, or lean meat: 1 serving a day ? Beans and legumes: 2 or more servings a week ? Nuts and seeds: 1-2 servings a day ? Whole grains: 6-8 servings a day ? Extra-virgin olive oil: 3-4 servings a day  Limit red meat and sweets to only a few servings a month What are my food choices?  Mediterranean diet ? Recommended ? Grains: Whole-grain pasta. Brown rice. Bulgar wheat. Polenta. Couscous. Whole-wheat bread. Orpah Cobb. ? Vegetables: Artichokes. Beets. Broccoli. Cabbage. Carrots. Eggplant. Green beans. Chard. Kale. Spinach. Onions. Leeks. Peas. Squash. Tomatoes. Peppers. Radishes. ? Fruits: Apples. Apricots. Avocado. Berries. Bananas. Cherries. Dates. Figs. Grapes.  Lemons. Melon. Oranges. Peaches. Plums. Pomegranate. ? Meats and other protein foods: Beans. Almonds. Sunflower seeds. Pine nuts. Peanuts. Cod. Salmon. Scallops. Shrimp. Tuna. Tilapia. Clams. Oysters. Eggs. ? Dairy: Low-fat milk. Cheese. Greek yogurt. ? Beverages: Water. Red wine. Herbal tea. ? Fats and oils: Extra virgin  olive oil. Avocado oil. Grape seed oil. ? Sweets and desserts: [place] yogurt with honey. Baked apples. Poached pears. Trail mix. ? Seasoning and other foods: Basil. Cilantro. Coriander. Cumin. Mint. Parsley. Sage. Rosemary. Tarragon. Garlic. Oregano. Thyme. Pepper. Balsalmic vinegar. Tahini. Hummus. Tomato sauce. Olives. Mushrooms. ? Limit these ? Grains: Prepackaged pasta or rice dishes. Prepackaged cereal with added sugar. ? Vegetables: Deep fried potatoes (french fries). ? Fruits: Fruit canned in syrup. ? Meats and other protein foods: Beef. Pork. Lamb. Poultry with skin. Hot dogs. Tomasa Blase. ? Dairy: Ice cream. Sour cream. Whole milk. ? Beverages: Juice. Sugar-sweetened soft drinks. Beer. Liquor and spirits. ? Fats and oils: Butter. Canola oil. Vegetable oil. Beef fat (tallow). Lard. ? Sweets and desserts: Cookies. Cakes. Pies. Candy. ? Seasoning and other foods: Mayonnaise. Premade sauces and marinades. ? The items listed may not be a complete list. Talk with your dietitian about what dietary choices are right for you. Summary  The Mediterranean diet includes both food and lifestyle choices.  Eat a variety of fresh fruits and vegetables, beans, nuts, seeds, and whole grains.  Limit the amount of red meat and sweets that you eat.  Talk with your health care provider about whether it is safe for you to drink red wine in moderation. This means 1 glass a day for nonpregnant women and 2 glasses a day for men. A glass of wine equals 5 oz (150 mL). This information is not intended to replace advice given to you by your health care provider. Make sure you discuss any questions  you have with your health care provider. Document Released: 09/21/2015 Document Revised: 10/24/2015 Document Reviewed: 09/21/2015 Elsevier Interactive Patient Education  2018 [employer].     Medication Instructions:  Continue same medications If you need a refill on your cardiac medications before your next appointment, please call your pharmacy.   Lab work: Have Lab in January 9 lipid panel,crp,lipoprotein a ) Lab opens Mon-Fri  8:00 am to 12:00 noon  No Appointment needed Nothing to eat or drink after midnight   Testing/Procedures: Schedule Cardiac Ct Calcium Score in 02/2018  Follow-Up: At [facility], you and your health needs are our priority.  As part of our continuing mission to provide you with exceptional heart care, we have created designated Provider Care Teams.  These Care Teams include your primary Cardiologist (physician) and Advanced Practice Providers (APPs -  Physician Assistants and Nurse Practitioners) who all work together to provide you with the care you need, when you need it. . Follow Up with Dr.Terius Jacuinde in 1 year  Call 2 months before to schedule      Signed, Jodelle Red, MD PhD 01/19/2018 5:49 PM    [facility]

## 2018-02-17 ENCOUNTER — Other Ambulatory Visit: Payer: Self-pay

## 2018-02-17 ENCOUNTER — Ambulatory Visit (INDEPENDENT_AMBULATORY_CARE_PROVIDER_SITE_OTHER)
Admission: RE | Admit: 2018-02-17 | Discharge: 2018-02-17 | Disposition: A | Discharge status: Home / Self Care | Payer: Self-pay | Source: Ambulatory Visit | Attending: Cardiology | Admitting: Cardiology

## 2018-02-17 DIAGNOSIS — E782 Mixed hyperlipidemia: Secondary | ICD-10-CM

## 2018-02-17 DIAGNOSIS — J029 Acute pharyngitis, unspecified: Secondary | ICD-10-CM | POA: Diagnosis not present

## 2018-02-17 DIAGNOSIS — Z8249 Family history of ischemic heart disease and other diseases of the circulatory system: Secondary | ICD-10-CM | POA: Diagnosis not present

## 2018-02-17 DIAGNOSIS — R6884 Jaw pain: Secondary | ICD-10-CM | POA: Diagnosis not present

## 2018-02-17 LAB — LIPID PANEL WITH LDL/HDL RATIO
Cholesterol, Total: 206 mg/dL — ABNORMAL HIGH (ref 100–199)
HDL: 69 mg/dL (ref >39)
LDL CALC: 117 mg/dL — AB (ref 0–99)
LDl/HDL Ratio: 1.7 ratio (ref 0.0–3.6)
TRIGLYCERIDES: 100 mg/dL (ref 0–149)
VLDL CHOLESTEROL CAL: 20 mg/dL (ref 5–40)

## 2018-02-17 LAB — HIGH SENSITIVITY CRP: CRP, High Sensitivity: 2 mg/L (ref 0.00–3.00)

## 2018-02-18 LAB — LIPOPROTEIN A (LPA): Lipoprotein (a): 200.7 nmol/L — ABNORMAL HIGH (ref <75.0)

## 2018-02-20 ENCOUNTER — Telehealth: Payer: Self-pay

## 2018-02-20 DIAGNOSIS — Z79899 Other long term (current) drug therapy: Secondary | ICD-10-CM

## 2018-02-20 MED ORDER — ROSUVASTATIN CALCIUM 20 MG PO TABS: 20.0000 mg | ORAL_TABLET | Freq: Every day | ORAL | 3 refills | Status: DC

## 2018-02-20 NOTE — Telephone Encounter (Signed)
-----   Message from Jodelle RedBridgette Christopher, MD sent at 02/18/2018  9:28 PM EST ----- The calcium score showed that the three major arteries of the heart all contain calcium, signaling that these arteries already have a significant amount of cholesterol plaque. Your calcium score was 227, which is higher than 89% of the other people in your age and gender group. You had a normal hs-CRP (inflammatory marker), but your lipoprotein a was very elevated. This elevated lipoprotein a can be genetic, so I would recommend other family members get this checked as well. Together, your coronary calcium, LDL (bad cholesterol), and lipoprotein a numbers all support being more aggressive with treatment. Technically the presence of coronary calcium officially diagnoses you as having coronary artery disease (even without a blockage that needs fixed), so your LDL goal should be less than 70. We can put you on a stronger statin medication; I would recommend changing from the pravastatin to rosuvastatin. We can start at rosuvastatin 20 mg daily and then recheck blood work in about 2 mos to see if we've reached the target. The other option is that I can have you get started with our lipid clinic. We have pharmacists who are trained to manage these medications, and they can make adjustments for you as well. You are also more than welcome to schedule an appointment to come back and discuss with me. Please let us know what you'd prefer.

## 2018-02-20 NOTE — Telephone Encounter (Signed)
Pt updated with lab results, along with Dr. Di Kindle recommendation. Pt verbalized understanding and agreeable to starting Crestor 20 mg. Rx sent to pt's preferred pharmacy. Pt also states he would prefer to scheduled f/u up with Dr. Cristal Deer. Appointment scheduled for 04/20/18 at 840 am. Pt will have fasting lab drawn a week prior. Lab slip mailed to pt.

## 2018-04-14 DIAGNOSIS — Z79899 Other long term (current) drug therapy: Secondary | ICD-10-CM | POA: Diagnosis not present

## 2018-04-14 LAB — LIPID PANEL
CHOL/HDL RATIO: 2.7 ratio (ref 0.0–5.0)
Cholesterol, Total: 153 mg/dL (ref 100–199)
HDL: 57 mg/dL (ref >39)
LDL Calculated: 78 mg/dL (ref 0–99)
Triglycerides: 92 mg/dL (ref 0–149)
VLDL CHOLESTEROL CAL: 18 mg/dL (ref 5–40)

## 2018-04-20 ENCOUNTER — Encounter: Payer: Self-pay | Admitting: Cardiology

## 2018-04-20 ENCOUNTER — Ambulatory Visit: Payer: 59 | Admitting: Cardiology

## 2018-04-20 VITALS — BP 148/90 | HR 86 | Ht 71.0 in | Wt 222.4 lb

## 2018-04-20 DIAGNOSIS — Z79899 Other long term (current) drug therapy: Secondary | ICD-10-CM

## 2018-04-20 DIAGNOSIS — Z712 Person consulting for explanation of examination or test findings: Secondary | ICD-10-CM

## 2018-04-20 DIAGNOSIS — Z8249 Family history of ischemic heart disease and other diseases of the circulatory system: Secondary | ICD-10-CM

## 2018-04-20 DIAGNOSIS — I10 Essential (primary) hypertension: Secondary | ICD-10-CM | POA: Diagnosis not present

## 2018-04-20 DIAGNOSIS — Z7189 Other specified counseling: Secondary | ICD-10-CM

## 2018-04-20 DIAGNOSIS — I2584 Coronary atherosclerosis due to calcified coronary lesion: Secondary | ICD-10-CM

## 2018-04-20 DIAGNOSIS — I2510 Atherosclerotic heart disease of native coronary artery without angina pectoris: Secondary | ICD-10-CM

## 2018-04-20 DIAGNOSIS — E7800 Pure hypercholesterolemia, unspecified: Secondary | ICD-10-CM | POA: Diagnosis not present

## 2018-04-20 MED ORDER — CHLORTHALIDONE 25 MG PO TABS: 25.0000 mg | ORAL_TABLET | Freq: Every day | ORAL | 6 refills | Status: DC

## 2018-04-20 NOTE — Patient Instructions (Signed)
Medication Instructions:  Start: Chlorthalidone 25 mg daily  If you need a refill on your cardiac medications before your next appointment, please call your pharmacy.   Lab work: Your physician recommends that you return for lab work in 1 week (BMP) and then 6 months ( Fasting Lipid, LPa)  If you have labs (blood work) drawn today and your tests are completely normal, you will receive your results only by: Marland Kitchen MyChart Message (if you have MyChart) OR . A paper copy in the mail If you have any lab test that is abnormal or we need to change your treatment, we will call you to review the results.  Testing/Procedures: Your physician has requested that you have an exercise tolerance test. For further information please visit [email or web address] Please also follow instruction sheet, as given. [address]  Follow-Up: At [facility], you and your health needs are our priority.  As part of our continuing mission to provide you with exceptional heart care, we have created designated Provider Care Teams.  These Care Teams include your primary Cardiologist (physician) and Advanced Practice Providers (APPs -  Physician Assistants and Nurse Practitioners) who all work together to provide you with the care you need, when you need it. You will need a follow up appointment in 6 months.  Please call our office 2 months in advance to schedule this appointment.  You may see Jodelle Red, MD or one of the following Advanced Practice Providers on your designated Care Team:   Theodore Demark, PA-C . Joni Reining, DNP, ANP

## 2018-04-20 NOTE — Progress Notes (Signed)
[facility] Office Note:    Date:  04/20/2018   ID:  Tyler [facility], DOB 04/26/1960, MRN [id number]  PCP:  [facility], Tyler Saucerean, MD  Cardiologist:  Tyler RedBridgette Tyler Sanjuan, MD PhD  Referring MD: [facility], Tyler Saucerean, MD   CC: follow up test results  History of Present Illness:    Tyler [facility] is a 58 y.o. male with a hx of hypothyroidism, hyperlipidemia, family history of CAD who is seen in follow up today. He was initially seen as a new consult on 01/19/18 at the request of [facility], Tyler Saucerean, MD for the evaluation and management of cardiovascular risk and prevention.  Cardiac history: Two brothers who had CAD in their 4750s and passed away. Lipid panel on pravastatin HDL 62, LDL 110, Tchol 190, TG 90. Negative ETT in 2013, records sent from [facility]. 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 Rare PVCs, otherwise normal.   Today: reviewed results of his lipids, CRP, and lp(a) as well as cardiac score: Lipids: initial Tchol 206, TG 100, HDL 69, LDL 117 Lp(a): 200.7 (ULN 75) Hs-CRP: 2.00 Calcium score: 227 (89th percentile), 3 vessel calcium  Started rosuvastatin 20 mg after the above testing. Tolerating this well, no issues.  Recheck lipids:  Tchol 153, TG 92, HDL 57, LDL 78  Discussed lifestyle, goals today. Also, BP has been elevated now x2 visits, also noted on prior admission in 2018. Discussed medication management options today.  Also discussed at length red flag warning signs for MI given his risk. He would like to start an exercise regimen, but with the CAD/calcium seen he wants to be sure it is safe to exercise. Discussed treadmill test today. Also discussed prescription for SL NG.   No chest pain, no shortness of breath. Can climb stairs, walks the trail. No syncope, palpitations, PND, orthopnea, LE edema.  Past Medical History:  Diagnosis Date  . GERD (gastroesophageal reflux disease)    none recent  . Hypercholesterolemia   . Hypothyroidism   . Pancreatic cyst   .  Thyroid disease     Past Surgical History:  Procedure Laterality Date  . ANTERIOR CERVICAL DECOMP/DISCECTOMY FUSION N/A 07/31/2016   Procedure: ANTERIOR CERVICAL DECOMPRESSION/DISCECTOMY FUSION 3 LEVELS;  Surgeon: [facility], Mark, MD;  Location: [facility];  Service: Orthopedics;  Laterality: N/A;  . EUS N/A 09/01/2013   Procedure: ESOPHAGEAL ENDOSCOPIC ULTRASOUND (EUS) RADIAL;  Surgeon: Tyler ModenaWilliam Outlaw, MD;  Location: [facility];  Service: Endoscopy;  Laterality: N/A;  . EYE SURGERY Bilateral age 75   lazy eye  . FINE NEEDLE ASPIRATION N/A 09/01/2013   Procedure: FINE NEEDLE ASPIRATION (FNA) LINEAR;  Surgeon: Tyler ModenaWilliam Outlaw, MD;  Location: [facility];  Service: Endoscopy;  Laterality: N/A;  . pinkie finger surgfery Right yrs ago    Current Medications: Current Outpatient Medications on File Prior to Visit  Medication Sig  . buPROPion (WELLBUTRIN XL) 300 MG 24 hr tablet Take 300 mg by mouth daily.  Marland Kitchen. levothyroxine (SYNTHROID, LEVOTHROID) 150 MCG tablet Take 150 mcg by mouth daily.  . rosuvastatin (CRESTOR) 20 MG tablet Take 1 tablet (20 mg total) by mouth daily.  . SYMBICORT 80-4.5 MCG/ACT inhaler Inhale 2 puffs into the lungs 2 (two) times daily.  . Turmeric 500 MG CAPS Take 1 capsule by mouth daily.   No current facility-administered medications on file prior to visit.      Allergies:   Penicillins   Social History   Socioeconomic History  . Marital status: Married    Spouse name: Not  on file  . Number of children: Not on file  . Years of education: Not on file  . Highest education level: Not on file  Occupational History  . Not on file  Social Needs  . Financial resource strain: Not on file  . Food insecurity:    Worry: Not on file    Inability: Not on file  . Transportation needs:    Medical: Not on file    Non-medical: Not on file  Tobacco Use  . Smoking status: Never Smoker  . Smokeless tobacco: Never Used  Substance and Sexual Activity  . Alcohol use: Yes     Comment: occasional  . Drug use: No  . Sexual activity: Yes  Lifestyle  . Physical activity:    Days per week: Not on file    Minutes per session: Not on file  . Stress: Not on file  Relationships  . Social connections:    Talks on phone: Not on file    Gets together: Not on file    Attends religious service: Not on file    Active member of club Smith organization: Not on file    Attends meetings of clubs Smith organizations: Not on file    Relationship status: Not on file  Other Topics Concern  . Not on file  Social History Narrative  . Not on file     Family History: The patient's family history includes COPD in his father; Cancer - Other in his mother; Coronary artery disease in his brother and brother.  two brothers with multiple heart attacks, both deceased. One brother passed away from surgery complications, one passed from heart disease. Initial heart attacks in their mid 50s. Both smokers, both had been heavy drinkers in the past. Mother had unknown heart disease, passed from another cause.Unsure of dad's history as he was not present. Also has 2 sisters in good health. Hs a 70 year old daughter, in good health.  ROS:   Please see the history of present illness.  Additional pertinent ROS:  Constitutional: Negative for chills, fever, night sweats, unintentional weight loss  HENT: Negative for ear pain and hearing loss.   Eyes: Negative for loss of vision and eye pain.  Respiratory: Negative for cough, sputum, shortness of breath, wheezing.   Cardiovascular: As per HPI. Gastrointestinal: Negative for abdominal pain, melena, and hematochezia.  Genitourinary: Negative for dysuria and hematuria.  Musculoskeletal: Negative for falls and myalgias.  Skin: Negative for itching and rash.  Neurological: Negative for focal weakness, focal sensory changes and loss of consciousness.  Endo/Heme/Allergies: Does not bruise/bleed easily.    EKGs/Labs/Other Studies Reviewed:    The  following studies were reviewed today: Records from Dr. Clovis Riley, including prior ETT   EKG:  EKG is personally reviewed.  The ekg ordered 01/19/18 demonstrates normal sinus rhythm  Recent Labs: No results found for requested labs within last 8760 hours.  Recent Lipid Panel    Component Value Date/Time   CHOL 153 04/14/2018 0846   TRIG 92 04/14/2018 0846   HDL 57 04/14/2018 0846   CHOLHDL 2.7 04/14/2018 0846   LDLCALC 78 04/14/2018 0846    Physical Exam:    VS:  BP (!) 148/90   Pulse 86   Ht 5\' 11"  (1.803 m)   Wt 222 lb 6.4 oz (100.9 kg)   BMI 31.02 kg/m     Wt Readings from Last 3 Encounters:  04/20/18 222 lb 6.4 oz (100.9 kg)  01/19/18 225 lb (102.1 kg)  07/31/16 225 lb (102.1 kg)    GEN: Well nourished, well developed in no acute distress HEENT: Normal NECK: No JVD; No carotid bruits LYMPHATICS: No lymphadenopathy CARDIAC: regular rhythm, normal S1 and S2, no murmurs, rubs, gallops. Radial and DP pulses 2+ bilaterally. RESPIRATORY:  Clear to auscultation without rales, wheezing Smith rhonchi  ABDOMEN: Soft, non-tender, non-distended MUSCULOSKELETAL:  No edema; No deformity  SKIN: Warm and dry NEUROLOGIC:  Alert and oriented x 3 PSYCHIATRIC:  Normal affect   ASSESSMENT:    1. Coronary artery disease due to calcified coronary lesion   2. Medication management   3. Essential hypertension   4. Pure hypercholesterolemia   5. Cardiac risk counseling   6. Counseling on health promotion and disease prevention   7. Encounter to discuss test results    PLAN:    1. Coronary calcium on CT: this is consistent with nonobstructive CAD. Reviewed his test results today. -have already intensified his statin regimen, responding well to this -with his significant risk factors, and now diagnosis of coronary calcium, he wants to make sure it is safe to exercise. Discussed options. Will pursue treadmill stress test. If equivocal, would continue risk management, but if symptomatic [facility]  highly concerning ECG findings, will need to discuss next steps for testing. -will discuss aspirin based on results of treadmill test  2. hypercholesterolemia, FH of significant CAD.  -high lp(a), may be driving much of his risk. Recommended first degree family members also have this tested -good response to increased intensity statin. Goal LDL <70 and reduction in lp(a). Recheck in 6 mos  3. Hypertension: elevated now on 2 visits and prior hospitalization.  -discussed thiazide, amlodipine, and ARB. Will start with thiazide  -recheck BMET in 1-2 weeks. Per [facility], prior Cr normal (1.01) within the last 6 mos, K was 3.8.  4. Secondary prevention -recommend heart healthy/Mediterranean diet, with whole grains, fruits, vegetable, fish, lean meats, nuts, and olive oil. Limit salt. -recommend moderate walking, 3-5 times/week for 30-50 minutes each session. Aim for at least 150 minutes.week. Goal should be pace of 3 miles/hours, Smith walking 1.5 miles in 30 minutes -recommend avoidance of tobacco products. Avoid excess alcohol. -Additional risk factor control:  -Diabetes: A1c is not available, denies history  -Lipids: as above  -Blood pressure control: as above. Goal <130/80.  -Weight: BMI 31, discussed lifestyle for weight management  Plan for follow up: 6 mos  TIME SPENT WITH PATIENT: 25 minutes of direct patient care. More than 50% of that time was spent on coordination of care and counseling regarding test results, management recommendations.  Tyler Red, MD, PhD [place]  [facility]   Medication Adjustments/Labs and Tests Ordered: Current medicines are reviewed at length with the patient today.  Concerns regarding medicines are outlined above.  Orders Placed This Encounter  Procedures  . Lipid panel  . Lipoprotein A (LPA)  . Basic metabolic panel  . EXERCISE TOLERANCE TEST (ETT)   Meds ordered this encounter  Medications  . chlorthalidone (HYGROTON) 25 MG tablet     Sig: Take 1 tablet (25 mg total) by mouth daily.    Dispense:  30 tablet    Refill:  6    Patient Instructions  Medication Instructions:  Start: Chlorthalidone 25 mg daily  If you need a refill on your cardiac medications before your next appointment, please call your pharmacy.   Lab work: Your physician recommends that you return for lab work in 1 week (BMP) and then 6 months (  Fasting Lipid, LPa)  If you have labs (blood work) drawn today and your tests are completely normal, you will receive your results only by: Marland Kitchen MyChart Message (if you have MyChart) [facility] . A paper copy in the mail If you have any lab test that is abnormal Smith we need to change your treatment, we will call you to review the results.  Testing/Procedures: Your physician has requested that you have an exercise tolerance test. For further information please visit [email or web address] Please also follow instruction sheet, as given. [address]  Follow-Up: At [facility], you and your health needs are our priority.  As part of our continuing mission to provide you with exceptional heart care, we have created designated Provider Care Teams.  These Care Teams include your primary Cardiologist (physician) and Advanced Practice Providers (APPs -  Physician Assistants and Nurse Practitioners) who all work together to provide you with the care you need, when you need it. You will need a follow up appointment in 6 months.  Please call our office 2 months in advance to schedule this appointment.  You may see Tyler Red, MD [facility] one of the following Advanced Practice Providers on your designated Care Team:   Theodore Demark, PA-C . Joni Reining, DNP, ANP        Signed, Tyler Red, MD PhD 04/20/2018 10:27 AM    [facility]

## 2018-04-21 ENCOUNTER — Telehealth (HOSPITAL_COMMUNITY): Payer: Self-pay

## 2018-04-21 NOTE — Telephone Encounter (Signed)
Encounter complete. 

## 2018-04-22 ENCOUNTER — Telehealth (HOSPITAL_COMMUNITY): Payer: Self-pay

## 2018-04-22 NOTE — Telephone Encounter (Signed)
Encounter complete. 

## 2018-04-23 ENCOUNTER — Inpatient Hospital Stay (HOSPITAL_COMMUNITY): Admission: RE | Admit: 2018-04-23 | Payer: 59 | Source: Ambulatory Visit

## 2018-04-24 ENCOUNTER — Telehealth (HOSPITAL_COMMUNITY): Payer: Self-pay

## 2018-04-24 NOTE — Telephone Encounter (Signed)
Encounter complete. 

## 2018-04-28 ENCOUNTER — Telehealth (HOSPITAL_COMMUNITY): Payer: Self-pay

## 2018-04-28 NOTE — Telephone Encounter (Signed)
Encounter complete. 

## 2018-04-29 ENCOUNTER — Telehealth: Payer: Self-pay

## 2018-04-29 ENCOUNTER — Ambulatory Visit (HOSPITAL_COMMUNITY)
Admission: RE | Admit: 2018-04-29 | Discharge: 2018-04-29 | Disposition: A | Discharge status: Home / Self Care | Payer: 59 | Source: Ambulatory Visit | Attending: Internal Medicine | Admitting: Internal Medicine

## 2018-04-29 DIAGNOSIS — I2584 Coronary atherosclerosis due to calcified coronary lesion: Principal | ICD-10-CM

## 2018-04-29 DIAGNOSIS — I2510 Atherosclerotic heart disease of native coronary artery without angina pectoris: Secondary | ICD-10-CM

## 2018-04-29 NOTE — Telephone Encounter (Signed)
Pt presented today for GXT. Per Tech, pt's first initial BP (standing ) was 151/108. After resting/sitting for 15-20 mins,  BP still remained elevated at 132/106. Pt report taking his daily dose of Chlorthalidone. Dr. Wilkie Aye (DOD) made aware and recommend to canceling test until BP is controlled. Will route to MD for recommendations.

## 2018-05-05 NOTE — Telephone Encounter (Signed)
Called patient, left message. Attempting to follow up on home blood pressures. If he has any home readings, would be happy to set up a phone visit to titrate medications. Left office number for call back.  Jodelle Red, MD, PhD [facility]  [address] [place], [place] [address] [phone]

## 2018-05-06 NOTE — Telephone Encounter (Signed)
Spoke with pt who states he hasn't been able to track BP reading as BP machine just came in the mail yesterday. He report he took BP twice yesterday and once this morning. All readings were around high 140's/90's. He denies any symptoms. Will route to MD.

## 2018-05-07 ENCOUNTER — Telehealth: Payer: Self-pay | Admitting: Internal Medicine

## 2018-05-07 NOTE — Telephone Encounter (Signed)
New Message   Patient returning Alisha's phone call.

## 2018-05-07 NOTE — Telephone Encounter (Signed)
   TELEPHONE CALL NOTE  Pt updated with Dr. Di Kindle recommendations. Tele-health visit scheduled for 3/31.  [facility] has been deemed a candidate for a follow-up tele-health visit to limit community exposure during the Covid-19 pandemic. I spoke with the patient via phone to ensure availability of phone/video source, confirm preferred email & phone number, and discuss instructions and expectations.  I reminded [facility] to be prepared with any vital sign and/or heart rhythm information that could potentially be obtained via home monitoring, at the time of his visit. I reminded [facility] to expect an e-mail containing a link for their video-based visit approximately 15 minutes before his visit, or alternatively, a phone call at the time of his visit if his visit is planned to be a phone encounter. Consent sent via mychart.     Parke Poisson, RN 05/07/2018 3:35 PM

## 2018-05-07 NOTE — Telephone Encounter (Signed)
Can you have him take daily BP readings and set him up for a telephone visit, maybe Tues 3/31 if that works for him? Thanks. We can then make a plan to adjust meds.

## 2018-05-07 NOTE — Telephone Encounter (Signed)
Left message to call back  

## 2018-05-07 NOTE — Telephone Encounter (Signed)
See previous encounter

## 2018-05-11 ENCOUNTER — Other Ambulatory Visit: Payer: Self-pay

## 2018-05-12 ENCOUNTER — Encounter: Payer: Self-pay | Admitting: Cardiology

## 2018-05-12 ENCOUNTER — Telehealth (INDEPENDENT_AMBULATORY_CARE_PROVIDER_SITE_OTHER): Payer: 59 | Admitting: Cardiology

## 2018-05-12 VITALS — BP 128/87 | HR 87 | Ht 71.0 in | Wt 220.0 lb

## 2018-05-12 DIAGNOSIS — I10 Essential (primary) hypertension: Secondary | ICD-10-CM | POA: Diagnosis not present

## 2018-05-12 MED ORDER — AMLODIPINE BESYLATE 5 MG PO TABS: 5.0000 mg | ORAL_TABLET | Freq: Every day | ORAL | 3 refills | Status: DC

## 2018-05-12 NOTE — Patient Instructions (Signed)
Medication Instructions:  Start: Amlodipine 5 mg daily   If you need a refill on your cardiac medications before your next appointment, please call your pharmacy.   Lab work: None  Testing/Procedures: None  Follow-Up: At [facility], you and your health needs are our priority.  As part of our continuing mission to provide you with exceptional heart care, we have created designated Provider Care Teams.  These Care Teams include your primary Cardiologist (physician) and Advanced Practice Providers (APPs -  Physician Assistants and Nurse Practitioners) who all work together to provide you with the care you need, when you need it. You will need a follow up appointment in 3 weeks.  Please call our office 2 months in advance to schedule this appointment.  You may see Jodelle Red, MD or one of the following Advanced Practice Providers on your designated Care Team:   Theodore Demark, PA-C . Joni Reining, DNP, ANP

## 2018-05-12 NOTE — Progress Notes (Signed)
Virtual Visit via Video Note    Evaluation Performed:  Follow-up visit  This visit type was conducted due to national recommendations for restrictions regarding the COVID-19 Pandemic (e.g. social distancing).  This format is felt to be most appropriate for this patient at this time.  All issues noted in this document were discussed and addressed.  No physical exam was performed (except for noted visual exam findings with Video Visits).  Please refer to the patient's chart (MyChart message for video visits and phone note for telephone visits) for the patient's consent to telehealth for [facility].  Date:  05/12/2018   ID:  Tyler Smith, DOB 03-Jan-1961, MRN [id number]  Patient Location:  [address]  [place], [place] [address]  Provider location:   [place], [place] - [facility]  PCP:  Clovis Riley, L.August Saucer, MD  Cardiologist:  Jodelle Red, MD   Chief Complaint:  Follow up of blood pressure  History of Present Illness:    Tyler Smith is a 58 y.o. male who presents via audio/video conferencing for a telehealth visit today.    The patient does not have symptoms concerning for COVID-19 infection (fever, chills, cough, or new shortness of breath).   Please see prior notes for full history. We have been meeting to discuss his risk factors for CAD. Focus of today's televisit is for blood pressure management. He was noted to have elevated BP on two visits, and we started chlorthalidone. Plan had been for stress test given that he wished to pursue increased activity level. However, on arrival for his stress test, his BP was elevated to 151/108, then 132/106. Test was cancelled due to elevated diastolic BP.  AM readings after taking medication. Also has been walking 2 miles in the AM, tries to do 1 mile at lunch, then another 2 miles after dinner. Takes BP after he has rested for some time from walking  From 3/24 to today, AM then PM readings. 148/95, 142/93   146/102, 143/98 140/89, 130/91 135/88, 144/86 129/86, 147/91 133/84 141/94, 137/91 128/87  Prior CV studies:   The following studies were reviewed today: As per chart  Past Medical History:  Diagnosis Date  . GERD (gastroesophageal reflux disease)    none recent  . Hypercholesterolemia   . Hypothyroidism   . Pancreatic cyst   . Thyroid disease    Past Surgical History:  Procedure Laterality Date  . ANTERIOR CERVICAL DECOMP/DISCECTOMY FUSION N/A 07/31/2016   Procedure: ANTERIOR CERVICAL DECOMPRESSION/DISCECTOMY FUSION 3 LEVELS;  Surgeon: Estill Bamberg, MD;  Location: [facility] OR;  Service: Orthopedics;  Laterality: N/A;  . EUS N/A 09/01/2013   Procedure: ESOPHAGEAL ENDOSCOPIC ULTRASOUND (EUS) RADIAL;  Surgeon: Willis Modena, MD;  Location: [facility];  Service: Endoscopy;  Laterality: N/A;  . EYE SURGERY Bilateral age 30   lazy eye  . FINE NEEDLE ASPIRATION N/A 09/01/2013   Procedure: FINE NEEDLE ASPIRATION (FNA) LINEAR;  Surgeon: Willis Modena, MD;  Location: [facility];  Service: Endoscopy;  Laterality: N/A;  . pinkie finger surgfery Right yrs ago     Current Meds  Medication Sig  . buPROPion (WELLBUTRIN XL) 300 MG 24 hr tablet Take 300 mg by mouth daily.  . chlorthalidone (HYGROTON) 25 MG tablet Take 1 tablet (25 mg total) by mouth daily.  Marland Kitchen levothyroxine (SYNTHROID, LEVOTHROID) 150 MCG tablet Take 150 mcg by mouth daily.  . rosuvastatin (CRESTOR) 20 MG tablet Take 1 tablet (20 mg total) by mouth daily.  . Turmeric 500 MG CAPS Take  1 capsule by mouth daily.     Allergies:   Penicillins   Social History   Tobacco Use  . Smoking status: Never Smoker  . Smokeless tobacco: Never Used  Substance Use Topics  . Alcohol use: Yes    Comment: occasional  . Drug use: No     Family Hx: The patient's family history includes COPD in his father; Cancer - Other in his mother; Coronary artery disease in his brother and brother.  ROS:   Please see the history of present  illness.    All other systems reviewed and are negative.   Labs/Other Tests and Data Reviewed:    Recent Labs: No results found for requested labs within last 8760 hours.   Recent Lipid Panel Lab Results  Component Value Date/Time   CHOL 153 04/14/2018 08:46 AM   TRIG 92 04/14/2018 08:46 AM   HDL 57 04/14/2018 08:46 AM   CHOLHDL 2.7 04/14/2018 08:46 AM   LDLCALC 78 04/14/2018 08:46 AM    Wt Readings from Last 3 Encounters:  05/12/18 220 lb (99.8 kg)  04/20/18 222 lb 6.4 oz (100.9 kg)  01/19/18 225 lb (102.1 kg)     Objective:    Vital Signs:  BP 128/87   Pulse 87   Ht 5\' 11"  (1.803 m)   Wt 220 lb (99.8 kg)   BMI 30.68 kg/m    Well nourished, well developed male in no acute distress.  ASSESSMENT & PLAN:    Hypertension: not at goal today. At presentation for his stress test, his diastolic number was the main concern -already working on lifestyle/exercise -chlorthalidone not affecting diastolic number much -discussed lisinopril, amlodipine. Given current COVID concerns, lack of data re: ACEi/ARB in COVID, and risk for cough with ACEi (and having that mistaken for infection), will proceed with amlodipine for now -start amlodipine 5 mg. Discussed potential side effects. If we can get him well controlled on amlodipine, would stop chlorthalidone as it does not appear to have had much effect for him -instructed on how to most accurately take home BP readings -will review BP logs at a virtual visit in 3 weeks  COVID-19 Education: The signs and symptoms of COVID-19 were discussed with the patient and how to seek care for testing (follow up with PCP or arrange E-visit).  The importance of social distancing was discussed today.  Patient Risk:   After full review of this patient's clinical status, I feel that they are at least moderate risk at this time.  Time:   Today, I have spent 25 minutes with the patient with telehealth technology discussing blood pressure management     Patient Instructions  Medication Instructions:  Start: Amlodipine 5 mg daily   If you need a refill on your cardiac medications before your next appointment, please call your pharmacy.   Lab work: None  Testing/Procedures: None  Follow-Up: At [facility], you and your health needs are our priority.  As part of our continuing mission to provide you with exceptional heart care, we have created designated Provider Care Teams.  These Care Teams include your primary Cardiologist (physician) and Advanced Practice Providers (APPs -  Physician Assistants and Nurse Practitioners) who all work together to provide you with the care you need, when you need it. You will need a follow up appointment in 3 weeks.  Please call our office 2 months in advance to schedule this appointment.  You may see Jodelle Red, MD or one of the following Advanced Practice  Providers on your designated Care Team:   Theodore Demark, PA-C . Joni Reining, DNP, ANP      Medication Adjustments/Labs and Tests Ordered: Current medicines are reviewed at length with the patient today.  Concerns regarding medicines are outlined above.  Tests Ordered: No orders of the defined types were placed in this encounter.  Medication Changes: Meds ordered this encounter  Medications  . amLODipine (NORVASC) 5 MG tablet    Sig: Take 1 tablet (5 mg total) by mouth daily.    Dispense:  90 tablet    Refill:  3    Disposition:  Follow up in 3 week(s) for webex meeting re: blood pressures  Signed, Jodelle Red, MD  05/12/2018 11:32 AM    [facility]

## 2018-05-28 ENCOUNTER — Telehealth: Payer: Self-pay

## 2018-05-28 NOTE — Telephone Encounter (Signed)
Attempted to contact pt to scheduled 3 week f/u with Dr. Cristal Deer on 4/20. Left message to call back.

## 2018-06-01 ENCOUNTER — Telehealth (INDEPENDENT_AMBULATORY_CARE_PROVIDER_SITE_OTHER): Payer: 59 | Admitting: Cardiology

## 2018-06-01 VITALS — BP 135/90 | Ht 71.0 in | Wt 210.0 lb

## 2018-06-01 DIAGNOSIS — Z7189 Other specified counseling: Secondary | ICD-10-CM | POA: Diagnosis not present

## 2018-06-01 DIAGNOSIS — I10 Essential (primary) hypertension: Secondary | ICD-10-CM | POA: Diagnosis not present

## 2018-06-01 NOTE — Patient Instructions (Signed)

## 2018-06-01 NOTE — Progress Notes (Signed)
Virtual Visit via Video Note   This visit type was conducted due to national recommendations for restrictions regarding the COVID-19 Pandemic (e.g. social distancing) in an effort to limit this patient's exposure and mitigate transmission in our community.  Due to his co-morbid illnesses, this patient is at least at moderate risk for complications without adequate follow up.  This format is felt to be most appropriate for this patient at this time.  All issues noted in this document were discussed and addressed.  A limited physical exam was performed with this format.  Please refer to the patient's chart for his consent [facility].   Evaluation Performed:  Follow-up visit  Date:  06/01/2018   ID:  Tyler Smith, DOB 05/02/1960, MRN [id number]  Patient Location: Home Provider Location: Home  PCP:  Clovis RileyMitchell, Elbert EwingsL.August Saucerean, MD  Cardiologist:  Jodelle RedBridgette Adit Riddles, MD  Electrophysiologist:  None   Chief Complaint:  Follow up medication management for hypertension  History of Present Illness:    Tyler Smith is a 58 y.o. male with hypertension, risk factors for ASCVD.  The patient does not have symptoms concerning for COVID-19 infection (fever, chills, cough, or new shortness of breath).   Overall doing well. Social distancing. Denies chest pain, shortness of breath at rest or with normal exertion. No PND, orthopnea, LE edema or unexpected weight gain. No syncope or palpitations. Walking ~5 miles/day without issue, losing weight intentionally.  Most recent BP numbers (first are AM, second are PM) 4/14 126/85, 131/84 4/15 125/80, 126/84 4/16 131/85, 122/84 4/17 130/88, 126/86 4/18 123/87, 125/91 4/19 127/86, 128/89   Past Medical History:  Diagnosis Date  . GERD (gastroesophageal reflux disease)    none recent  . Hypercholesterolemia   . Hypothyroidism   . Pancreatic cyst   . Thyroid disease    Past Surgical History:  Procedure Laterality Date  . ANTERIOR  CERVICAL DECOMP/DISCECTOMY FUSION N/A 07/31/2016   Procedure: ANTERIOR CERVICAL DECOMPRESSION/DISCECTOMY FUSION 3 LEVELS;  Surgeon: Estill Bambergumonski, Mark, MD;  Location: [facility] OR;  Service: Orthopedics;  Laterality: N/A;  . EUS N/A 09/01/2013   Procedure: ESOPHAGEAL ENDOSCOPIC ULTRASOUND (EUS) RADIAL;  Surgeon: Willis ModenaWilliam Outlaw, MD;  Location: [facility];  Service: Endoscopy;  Laterality: N/A;  . EYE SURGERY Bilateral age 38   lazy eye  . FINE NEEDLE ASPIRATION N/A 09/01/2013   Procedure: FINE NEEDLE ASPIRATION (FNA) LINEAR;  Surgeon: Willis ModenaWilliam Outlaw, MD;  Location: [facility];  Service: Endoscopy;  Laterality: N/A;  . pinkie finger surgfery Right yrs ago     Current Meds  Medication Sig  . amLODipine (NORVASC) 5 MG tablet Take 1 tablet (5 mg total) by mouth daily.  Marland Kitchen. buPROPion (WELLBUTRIN XL) 300 MG 24 hr tablet Take 300 mg by mouth daily.  . chlorthalidone (HYGROTON) 25 MG tablet Take 1 tablet (25 mg total) by mouth daily.  Marland Kitchen. levothyroxine (SYNTHROID, LEVOTHROID) 150 MCG tablet Take 150 mcg by mouth daily.  . rosuvastatin (CRESTOR) 20 MG tablet Take 1 tablet (20 mg total) by mouth daily.  . SYMBICORT 80-4.5 MCG/ACT inhaler Inhale 2 puffs into the lungs as needed.   . Turmeric 500 MG CAPS Take 1 capsule by mouth daily.     Allergies:   Penicillins   Social History   Tobacco Use  . Smoking status: Never Smoker  . Smokeless tobacco: Never Used  Substance Use Topics  . Alcohol use: Yes    Comment: occasional  . Drug use: No     Family Hx:  The patient's family history includes COPD in his father; Cancer - Other in his mother; Coronary artery disease in his brother and brother.  ROS:   Please see the history of present illness.    All other systems reviewed and are negative.   Prior CV studies:   The following studies were reviewed today: Attempted ETT  Labs/Other Tests and Data Reviewed:    EKG:  An ECG dated 01/19/18 was personally reviewed today and demonstrated:  NSR  Recent  Labs: No results found for requested labs within last 8760 hours.   Recent Lipid Panel Lab Results  Component Value Date/Time   CHOL 153 04/14/2018 08:46 AM   TRIG 92 04/14/2018 08:46 AM   HDL 57 04/14/2018 08:46 AM   CHOLHDL 2.7 04/14/2018 08:46 AM   LDLCALC 78 04/14/2018 08:46 AM    Wt Readings from Last 3 Encounters:  06/01/18 210 lb (95.3 kg)  05/12/18 220 lb (99.8 kg)  04/20/18 222 lb 6.4 oz (100.9 kg)     Objective:    Vital Signs:  BP 135/90   Ht  (1.803 m)   Wt 210 lb (95.3 kg)   BMI 29.29 kg/m    VITAL SIGNS:  reviewed GEN:  no acute distress EYES:  sclerae anicteric, EOMI - Extraocular Movements Intact RESPIRATORY:  normal respiratory effort, symmetric expansion CARDIOVASCULAR:  no JVD noted on video MUSCULOSKELETAL:  no obvious deformities. PSYCH:  normal affect  ASSESSMENT & PLAN:    1. Hypertension: continued improvement today, though diastolic number still above goal -continue amlodipine 5 mg daily, chlorthalidone 25 mg daily -ok to change to daily/several times per week BP measurements -watching salt, working on exercise and lifestyle -with continue lifestyle improvement, he may be able to get his diastolic numbers better, though if remains above goal could increase amlodipine dose  2. 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 -reviewed secondary prevention  COVID-19 Education: The signs and symptoms of COVID-19 were discussed with the patient and how to seek care for testing (follow up with PCP or arrange E-visit).  The importance of social distancing was discussed today.  Time:   Today, I have spent 15 minutes with the patient with telehealth technology discussing the above problems.    Patient Instructions  Medication Instructions:  Your Physician recommend you  continue on your current medication as directed.    If you need a refill on your cardiac medications before your next appointment, please call your pharmacy.   Lab work: None  Testing/Procedures: None  Follow-Up: At [facility], you and your health needs are our priority.  As part of our continuing mission to provide you with exceptional heart care, we have created designated Provider Care Teams.  These Care Teams include your primary Cardiologist (physician) and Advanced Practice Providers (APPs -  Physician Assistants and Nurse Practitioners) who all work together to provide you with the care you need, when you need it. You will need a follow up appointment in 6 months.  Please call our office 2 months in advance to schedule this appointment.  You may see Jodelle Red, MD or one of the following Advanced Practice Providers on your designated Care Team:   Theodore Demark, PA-C . Joni Reining, DNP, ANP       Medication Adjustments/Labs and Tests Ordered: Current medicines are reviewed at length with the patient today.  Concerns regarding medicines are outlined above.   Tests Ordered: No orders of the defined types were placed in this encounter.   Medication Changes: No orders of the defined types were placed in this encounter.   Disposition:  Follow up 6 mos  Signed, Jodelle Red, MD  06/01/2018  [facility]

## 2018-06-02 ENCOUNTER — Encounter: Payer: Self-pay | Admitting: Cardiology

## 2018-11-17 ENCOUNTER — Other Ambulatory Visit: Payer: Self-pay | Admitting: Cardiology

## 2019-01-20 ENCOUNTER — Ambulatory Visit: Payer: 59 | Admitting: Cardiology

## 2019-01-20 ENCOUNTER — Encounter: Payer: Self-pay | Admitting: Cardiology

## 2019-01-20 ENCOUNTER — Other Ambulatory Visit: Payer: Self-pay

## 2019-01-20 VITALS — BP 124/74 | HR 74 | Ht 71.0 in | Wt 208.0 lb

## 2019-01-20 DIAGNOSIS — I10 Essential (primary) hypertension: Secondary | ICD-10-CM

## 2019-01-20 DIAGNOSIS — Z7189 Other specified counseling: Secondary | ICD-10-CM

## 2019-01-20 DIAGNOSIS — Z8249 Family history of ischemic heart disease and other diseases of the circulatory system: Secondary | ICD-10-CM

## 2019-01-20 DIAGNOSIS — E7800 Pure hypercholesterolemia, unspecified: Secondary | ICD-10-CM | POA: Diagnosis not present

## 2019-01-20 DIAGNOSIS — I2510 Atherosclerotic heart disease of native coronary artery without angina pectoris: Secondary | ICD-10-CM

## 2019-01-20 DIAGNOSIS — I2584 Coronary atherosclerosis due to calcified coronary lesion: Secondary | ICD-10-CM

## 2019-01-20 NOTE — Patient Instructions (Signed)
Medication Instructions:  Your Physician recommend you continue on your current medication as directed.    *If you need a refill on your cardiac medications before your next appointment, please call your pharmacy*  Lab Work: Your physician recommends that you return for lab work prior to 6 month follow up appointment.   If you have labs (blood work) drawn today and your tests are completely normal, you will receive your results only by: Marland Kitchen MyChart Message (if you have MyChart) OR . A paper copy in the mail If you have any lab test that is abnormal or we need to change your treatment, we will call you to review the results.  Testing/Procedures: None  Follow-Up: At [facility], you and your health needs are our priority.  As part of our continuing mission to provide you with exceptional heart care, we have created designated Provider Care Teams.  These Care Teams include your primary Cardiologist (physician) and Advanced Practice Providers (APPs -  Physician Assistants and Nurse Practitioners) who all work together to provide you with the care you need, when you need it.  Your next appointment:   6 month(s)  The format for your next appointment:   In Person  Provider:   Buford Dresser, MD

## 2019-01-20 NOTE — Progress Notes (Signed)
Cardiology Office Note:    Date:  01/20/2019   ID:  Tyler Smith, DOB 1960-11-05, MRN [id number]  PCP:  Asencion Gowda.August Saucer, MD  Cardiologist:  Jodelle Red, MD PhD  Referring MD: Asencion Gowda.August Saucer, MD   CC: follow up  History of Present Illness:    Tyler Smith is a 58 y.o. male with a hx of hypothyroidism, hyperlipidemia, family history of CAD who is seen in follow up today. He was initially seen as a new consult on 01/19/18 at the request of Clovis Riley, L.August Saucer, MD for the evaluation and management of cardiovascular risk and prevention.  Cardiac history: Two brothers who had CAD in their 59s and passed away. Lipid panel on pravastatin HDL 62, LDL 110, Tchol 190, TG 90. Negative ETT in 2013, records sent from [facility]. 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 Rare PVCs, otherwise normal.   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 Recheck lipids 04/14/18 Tchol 153, TG 92, HDL 57, LDL 78  Today: Doing very well overall. No symptoms. Walking in the morning, does some jogging as well, 2-3 miles each day. BP averaging 120s/70s at home. Has focused on diet and exercise, has lost weight (10 lbs in 4 weeks when he started exercising). Feels much better with lifestyle changes.   Reviewed labs with him today.  Lipids Tchol 167, HDL 68, LDL 85, TG 73. LFTs nl Blood sugar 101, K 3.1, Cr 1.06  His PCP's office called with his lab results. They planned to stop chlorthalidone and start lisinopril. Discussed today how each of these meds work, what to watch for, side effects of each.  We again reviewed CV risk and guidelines at length today. He is just above goal of LDL <70. Already has excellent lifestyle, on 20 mg rosuvastatin. Discussed plateau of statin efficacy. Discussed calcium score and mortality risk.  Discussed aternative agents, such as ezetimibe.  Denies chest pain, shortness of breath at rest or with normal exertion. No PND, orthopnea, LE edema or unexpected weight gain. No syncope or palpitations.  Past Medical History:  Diagnosis Date  . GERD (gastroesophageal reflux disease)    none recent  . Hypercholesterolemia   . Hypothyroidism   . Pancreatic cyst   . Thyroid disease     Past Surgical History:  Procedure Laterality Date  . ANTERIOR CERVICAL DECOMP/DISCECTOMY FUSION N/A 07/31/2016   Procedure: ANTERIOR CERVICAL DECOMPRESSION/DISCECTOMY FUSION 3 LEVELS;  Surgeon: Estill Bamberg, MD;  [facility]: [facility];  Service: Orthopedics;  Laterality: N/A;  . EUS N/A 09/01/2013   Procedure: ESOPHAGEAL ENDOSCOPIC ULTRASOUND (EUS) RADIAL;  Surgeon: Willis Modena, MD;  Location: [facility];  Service: Endoscopy;  Laterality: N/A;  . EYE SURGERY Bilateral age 87   lazy eye  . FINE NEEDLE ASPIRATION N/A 09/01/2013   Procedure: FINE NEEDLE ASPIRATION (FNA) LINEAR;  Surgeon: Willis Modena, MD;  Location: [facility];  Service: Endoscopy;  Laterality: N/A;  . pinkie finger surgfery Right yrs ago    Current Medications: Current Outpatient Medications on File Prior to Visit  Medication Sig  . amLODipine (NORVASC) 5 MG tablet Take 1 tablet (5 mg total) by mouth daily.  Marland Kitchen buPROPion (WELLBUTRIN XL) 300 MG 24 hr tablet Take 300 mg by mouth daily.  . chlorthalidone (HYGROTON) 25 MG tablet TAKE 1 TABLET BY MOUTH EVERY DAY  .  levothyroxine (SYNTHROID, LEVOTHROID) 150 MCG tablet Take 150 mcg by mouth daily.  . rosuvastatin (CRESTOR) 20 MG tablet Take 1 tablet (20 mg total) by mouth daily.  . SYMBICORT 80-4.5 MCG/ACT inhaler Inhale 2 puffs into the lungs as needed.   . Turmeric 500 MG CAPS Take 1 capsule by mouth daily.   No current facility-administered medications on file prior to visit.      Allergies:   Penicillins   Social History   Tobacco Use  . Smoking status: Never Smoker  . Smokeless  tobacco: Never Used  Substance Use Topics  . Alcohol use: Yes    Comment: occasional  . Drug use: No    Family History: The patient's family history includes COPD in his father; Cancer - Other in his mother; Coronary artery disease in his brother and brother.  two brothers with multiple heart attacks, both deceased. One brother passed away from surgery complications, one passed from heart disease. Initial heart attacks in their mid 50s. Both smokers, both had been heavy drinkers in the past. Mother had unknown heart disease, passed from another cause.Unsure of dad's history as he was not present. Also has 2 sisters in good health. Has a 56 year old daughter, in good health.  ROS:   Please see the history of present illness.  Additional pertinent ROS: Constitutional: Negative for chills, fever, night sweats, unintentional weight loss  HENT: Negative for ear pain and hearing loss.   Eyes: Negative for loss of vision and eye pain.  Respiratory: Negative for cough, sputum, wheezing.   Cardiovascular: See HPI. Gastrointestinal: Negative for abdominal pain, melena, and hematochezia.  Genitourinary: Negative for dysuria and hematuria.  Musculoskeletal: Negative for falls and myalgias.  Skin: Negative for itching and rash.  Neurological: Negative for focal weakness, focal sensory changes and loss of consciousness.  Endo/Heme/Allergies: Does not bruise/bleed easily.    EKGs/Labs/Other Studies Reviewed:    The following studies were reviewed today: Records from Dr. Clovis RileyMitchell, including prior ETT   EKG:  EKG is personally reviewed.  The ekg ordered today demonstrates normal sinus rhythm, nonspecific t waves  Recent Labs: No results found for requested labs within last 8760 hours.  Recent Lipid Panel    Component Value Date/Time   CHOL 153 04/14/2018 0846   TRIG 92 04/14/2018 0846   HDL 57 04/14/2018 0846   CHOLHDL 2.7 04/14/2018 0846   LDLCALC 78 04/14/2018 0846    Physical Exam:     VS:  BP 124/74   Pulse 74   Ht 5\' 11"  (1.803 m)   Wt 208 lb (94.3 kg)   SpO2 96%   BMI 29.01 kg/m     Wt Readings from Last 3 Encounters:  01/20/19 208 lb (94.3 kg)  06/01/18 210 lb (95.3 kg)  05/12/18 220 lb (99.8 kg)    GEN: Well nourished, well developed in no acute distress HEENT: Normal, moist mucous membranes NECK: No JVD CARDIAC: regular rhythm, normal S1 and S2, no rubs or gallops. No murmur. VASCULAR: Radial and DP pulses 2+ bilaterally. No carotid bruits RESPIRATORY:  Clear to auscultation without rales, wheezing or rhonchi  ABDOMEN: Soft, non-tender, non-distended MUSCULOSKELETAL:  Ambulates independently SKIN: Warm and dry, no edema NEUROLOGIC:  Alert and oriented x 3. No focal neuro deficits noted. PSYCHIATRIC:  Normal affect   ASSESSMENT:    1. Pure hypercholesterolemia   2. Coronary artery disease due to calcified coronary lesion   3. Coronary artery calcification seen on CT scan   4. Family  history of heart disease   5. Essential hypertension   6. Cardiac risk counseling   7. Counseling on health promotion and disease prevention    PLAN:    Coronary calcium on CT: this is consistent with nonobstructive CAD, though not highest risk group (which is Ca score >400).  -showed him graph of Ca score and mortality today -on high intensity statin -has done an excellent job with lifestyle -asymptomatic even with significant exertion -treadmill stress test cancelled 10/3714 due to diastolic hypertension -consider aspirin addition at next visit   hypercholesterolemia, FH of significant CAD, elevated Lp(a) -LDL improved on rosuvastatin 20 mg -discussed increase of rosuvastatin to 40 mg, addition of ezetimibe today. Discussed incremental benefit in CV risk reduction -he is close, though not at, goal. Would recheck lipids fasting and lp(a) in 6 mos prior to our visit, then rediscuss -continue excellent lifestyle -have discussed family screening previously   Hypertension: now well controlled -did well on chlorthalidone. However, recent labs showed K of 3.1. He has been instructed to stop chlorthalidone and start lisinopril by his PCP  Secondary prevention -recommend heart healthy/Mediterranean diet, with whole grains, fruits, vegetable, fish, lean meats, nuts, and olive oil. Limit salt. -recommend moderate walking, 3-5 times/week for 30-50 minutes each session. Aim for at least 150 minutes.week. Goal should be pace of 3 miles/hours, or walking 1.5 miles in 30 minutes -recommend avoidance of tobacco products. Avoid excess alcohol.  Plan for follow up: 6 mos  TIME SPENT WITH PATIENT: 29 minutes of direct patient care. More than 50% of that time was spent on coordination of care and counseling regarding labs, guidelines, and management options.  Buford Dresser, MD, PhD [place]  [facility]   Medication Adjustments/Labs and Tests Ordered: Current medicines are reviewed at length with the patient today.  Concerns regarding medicines are outlined above.  Orders Placed This Encounter  Procedures  . Lipid panel  . Lipoprotein A (LPA)  . EKG 12-Lead   No orders of the defined types were placed in this encounter.   Patient Instructions  Medication Instructions:  Your Physician recommend you continue on your current medication as directed.    *If you need a refill on your cardiac medications before your next appointment, please call your pharmacy*  Lab Work: Your physician recommends that you return for lab work prior to 6 month follow up appointment.   If you have labs (blood work) drawn today and your tests are completely normal, you will receive your results only by: Marland Kitchen MyChart Message (if you have MyChart) OR . A paper copy in the mail If you have any lab test that is abnormal or we need to change your treatment, we will call you to review the results.  Testing/Procedures: None  Follow-Up: At [facility], you and  your health needs are our priority.  As part of our continuing mission to provide you with exceptional heart care, we have created designated Provider Care Teams.  These Care Teams include your primary Cardiologist (physician) and Advanced Practice Providers (APPs -  Physician Assistants and Nurse Practitioners) who all work together to provide you with the care you need, when you need it.  Your next appointment:   6 month(s)  The format for your next appointment:   In Person  Provider:   Buford Dresser, MD      Signed, Buford Dresser, MD PhD 01/20/2019 5:56 PM    [place]

## 2019-02-16 ENCOUNTER — Other Ambulatory Visit: Payer: Self-pay | Admitting: Cardiology

## 2019-05-07 ENCOUNTER — Other Ambulatory Visit: Payer: Self-pay | Admitting: Cardiology

## 2019-07-19 ENCOUNTER — Ambulatory Visit: Payer: 59 | Admitting: Cardiology

## 2019-07-19 ENCOUNTER — Other Ambulatory Visit: Payer: Self-pay

## 2019-07-19 ENCOUNTER — Encounter: Payer: Self-pay | Admitting: Cardiology

## 2019-07-19 VITALS — BP 130/84 | HR 88 | Ht 72.0 in | Wt 210.0 lb

## 2019-07-19 DIAGNOSIS — E7800 Pure hypercholesterolemia, unspecified: Secondary | ICD-10-CM

## 2019-07-19 DIAGNOSIS — Z79899 Other long term (current) drug therapy: Secondary | ICD-10-CM | POA: Diagnosis not present

## 2019-07-19 DIAGNOSIS — I2510 Atherosclerotic heart disease of native coronary artery without angina pectoris: Secondary | ICD-10-CM

## 2019-07-19 DIAGNOSIS — Z8249 Family history of ischemic heart disease and other diseases of the circulatory system: Secondary | ICD-10-CM | POA: Diagnosis not present

## 2019-07-19 DIAGNOSIS — Z7189 Other specified counseling: Secondary | ICD-10-CM

## 2019-07-19 DIAGNOSIS — I2584 Coronary atherosclerosis due to calcified coronary lesion: Secondary | ICD-10-CM

## 2019-07-19 DIAGNOSIS — I10 Essential (primary) hypertension: Secondary | ICD-10-CM

## 2019-07-19 MED ORDER — ASPIRIN EC 81 MG PO TBEC: 81.0000 mg | DELAYED_RELEASE_TABLET | Freq: Every day | ORAL | 3 refills | Status: AC

## 2019-07-19 NOTE — Patient Instructions (Addendum)
Medication Instructions:  Start Aspirin 81 mg daily  *If you need a refill on your cardiac medications before your next appointment, please call your pharmacy*   Lab Work: Your physician recommends that you return for lab work ( Fasting lipid, LPa)  If you have labs (blood work) drawn today and your tests are completely normal, you will receive your results only by: Marland Kitchen MyChart Message (if you have MyChart) OR . A paper copy in the mail If you have any lab test that is abnormal or we need to change your treatment, we will call you to review the results.   Testing/Procedures: None   Follow-Up: At [facility], you and your health needs are our priority.  As part of our continuing mission to provide you with exceptional heart care, we have created designated Provider Care Teams.  These Care Teams include your primary Cardiologist (physician) and Advanced Practice Providers (APPs -  Physician Assistants and Nurse Practitioners) who all work together to provide you with the care you need, when you need it.  We recommend signing up for the patient portal called "MyChart".  Sign up information is provided on this After Visit Summary.  MyChart is used to connect with patients for Virtual Visits (Telemedicine).  Patients are able to view lab/test results, encounter notes, upcoming appointments, etc.  Non-urgent messages can be sent to your provider as well.   To learn more about what you can do with MyChart, go to [email or web address].    Your next appointment:   6 month(s)  The format for your next appointment:   In Person  Provider:   Jodelle Red, MD

## 2019-07-19 NOTE — Progress Notes (Signed)
Cardiology Office Note:    Date:  07/19/2019   ID:  [name], DOB 11/26/1960, MRN [id number]  PCP:  Asencion Gowda.August Saucer, MD  Cardiologist:  Jodelle Red, MD PhD  Referring MD: Asencion Gowda.August Saucer, MD   CC: follow up  History of Present Illness:    Tyler Smith is a 59 y.o. male with a hx of hypothyroidism, hyperlipidemia, family history of CAD who is seen in follow up today. He was initially seen as a new consult on 01/19/18 at the request of Clovis Riley, L.August Saucer, MD for the evaluation and management of cardiovascular risk and prevention.  Cardiac history: Two brothers who had CAD in their 36s and passed away. Lipid panel on pravastatin HDL 62, LDL 110, Tchol 190, TG 90. Negative ETT in 2013, records sent from [facility]. 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 Rare PVCs, otherwise normal.   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 Recheck lipids 04/14/18 Tchol 153, TG 92, HDL 57, LDL 78  Today: Doing well overall. Walks 2 miles in the morning, if weather cooperates walks a mile during lunch, then is active at night. Sometimes jogs on his walks as well.  Checks BP several times a week, average 101s/11s.  20 year old nephew in [place] just had 5V CABG.  Denies chest pain, shortness of breath at rest or with normal exertion. No PND, orthopnea, LE edema or unexpected weight gain. No syncope or palpitations.  Past Medical History:  Diagnosis Date  . GERD (gastroesophageal reflux disease)    none recent  . Hypercholesterolemia   . Hypothyroidism   . Pancreatic cyst   . Thyroid disease     Past Surgical History:  Procedure Laterality Date  . ANTERIOR CERVICAL DECOMP/DISCECTOMY FUSION N/A 07/31/2016   Procedure: ANTERIOR CERVICAL DECOMPRESSION/DISCECTOMY FUSION 3 LEVELS;  Surgeon: Estill Bamberg, MD;  Location: [facility] OR;  Service: Orthopedics;  Laterality: N/A;  . EUS N/A 09/01/2013   Procedure: ESOPHAGEAL ENDOSCOPIC ULTRASOUND (EUS) RADIAL;  Surgeon: Willis Modena, MD;  Location: [facility];  Service: Endoscopy;  Laterality: N/A;  . EYE SURGERY Bilateral age 89   lazy eye  . FINE NEEDLE ASPIRATION N/A 09/01/2013   Procedure: FINE NEEDLE ASPIRATION (FNA) LINEAR;  Surgeon: Willis Modena, MD;  Location: [facility];  Service: Endoscopy;  Laterality: N/A;  . pinkie finger surgfery Right yrs ago    Current Medications: Current Outpatient Medications on File Prior to Visit  Medication Sig  . amLODipine (NORVASC) 5 MG tablet TAKE 1 TABLET BY MOUTH EVERY DAY  . buPROPion (WELLBUTRIN XL) 300 MG 24 hr tablet Take 300 mg by mouth daily.  . chlorthalidone (HYGROTON) 25 MG tablet TAKE 1 TABLET BY MOUTH EVERY DAY  . levothyroxine (SYNTHROID, LEVOTHROID) 150 MCG tablet Take 150 mcg by mouth daily.  . rosuvastatin (CRESTOR) 20 MG tablet TAKE 1 TABLET BY MOUTH EVERY DAY  . SYMBICORT 80-4.5 MCG/ACT inhaler Inhale 2 puffs into the lungs as needed.   . Turmeric 500 MG CAPS Take 1 capsule by mouth daily.   No current facility-administered medications on file prior to visit.     Allergies:   Penicillins   Social History   Tobacco Use  . Smoking status: Never Smoker  . Smokeless tobacco: Never Used  Substance Use Topics  . Alcohol use: Yes  Comment: occasional  . Drug use: No    Family History: The patient's family history includes COPD in his father; Cancer - Other in his mother; Coronary artery disease in his brother and brother.  two brothers with multiple heart attacks, both deceased. One brother passed away from surgery complications, one passed from heart disease. Initial heart attacks in their mid 50s. Both smokers, both had been heavy drinkers in the past. Mother had unknown heart disease, passed from another cause.Unsure of dad's history as he was not present. Also has 2  sisters in good health. Has a 37 year old daughter, in good health.  ROS:   Please see the history of present illness.  Additional pertinent ROS otherwise unremarkable.  EKGs/Labs/Other Studies Reviewed:    The following studies were reviewed today: Records from Dr. Clovis Riley, including prior ETT  EKG:  EKG is personally reviewed.  The ekg ordered 01/20/2019 demonstrates normal sinus rhythm, nonspecific t waves  Recent Labs: No results found for requested labs within last 8760 hours.  Recent Lipid Panel    Component Value Date/Time   CHOL 153 04/14/2018 0846   TRIG 92 04/14/2018 0846   HDL 57 04/14/2018 0846   CHOLHDL 2.7 04/14/2018 0846   LDLCALC 78 04/14/2018 0846    Physical Exam:    VS:  BP 130/84   Pulse 88   Ht 6' (1.829 m)   Wt 210 lb (95.3 kg)   SpO2 97%   BMI 28.48 kg/m     Wt Readings from Last 3 Encounters:  07/19/19 210 lb (95.3 kg)  01/20/19 208 lb (94.3 kg)  06/01/18 210 lb (95.3 kg)    GEN: Well nourished, well developed in no acute distress HEENT: Normal, moist mucous membranes NECK: No JVD CARDIAC: regular rhythm, normal S1 and S2, no rubs or gallops. No murmur. VASCULAR: Radial and DP pulses 2+ bilaterally. No carotid bruits RESPIRATORY:  Clear to auscultation without rales, wheezing or rhonchi  ABDOMEN: Soft, non-tender, non-distended MUSCULOSKELETAL:  Ambulates independently SKIN: Warm and dry, no edema NEUROLOGIC:  Alert and oriented x 3. No focal neuro deficits noted. PSYCHIATRIC:  Normal affect   ASSESSMENT:    1. Coronary artery disease due to calcified coronary lesion   2. Medication management   3. Pure hypercholesterolemia   4. Family history of heart disease   5. Essential hypertension   6. Cardiac risk counseling   7. Counseling on health promotion and disease prevention    PLAN:    Coronary calcium on CT: this is consistent with nonobstructive CAD, though not highest risk group (which is Ca score >400).  -on high intensity  statin -has done an excellent job with lifestyle -asymptomatic even with significant exertion -treadmill stress test cancelled 04/2018 due to diastolic hypertension -restart aspirin 81 mg today, no bleeding issues in the past and none currently  hypercholesterolemia, FH of significant CAD, elevated Lp(a) -recheck lipids --> After visit, updated lipids show LDL 69, TG 53, though lp(a) remains elevated at 185 -continue rosuvastatin 20 mg daily -if indications change in the future, consider PCSK9i for elevated lp(a) -continue excellent lifestyle -have discussed family screening previously  Hypertension: near goal today -currently on lisinopril-HCTZ 10-12.5 mg daily -monitor closely  Secondary prevention -recommend heart healthy/Mediterranean diet, with whole grains, fruits, vegetable, fish, lean meats, nuts, and olive oil. Limit salt. -recommend moderate walking, 3-5 times/week for 30-50 minutes each session. Aim for at least 150 minutes.week. Goal should be pace of 3 miles/hours, or walking 1.5 miles in  30 minutes -recommend avoidance of tobacco products. Avoid excess alcohol.  Plan for follow up: 6 mos  Buford Dresser, MD, PhD [place]  [facility]   Medication Adjustments/Labs and Tests Ordered: Current medicines are reviewed at length with the patient today.  Concerns regarding medicines are outlined above.  Orders Placed This Encounter  Procedures  . Lipoprotein A (LPA)  . Lipid panel   Meds ordered this encounter  Medications  . aspirin EC 81 MG tablet    Sig: Take 1 tablet (81 mg total) by mouth daily.    Dispense:  90 tablet    Refill:  3    Patient Instructions  Medication Instructions:  Start Aspirin 81 mg daily  *If you need a refill on your cardiac medications before your next appointment, please call your pharmacy*   Lab Work: Your physician recommends that you return for lab work ( Fasting lipid, LPa)  If you have labs (blood work) drawn  today and your tests are completely normal, you will receive your results only by: Marland Kitchen MyChart Message (if you have MyChart) OR . A paper copy in the mail If you have any lab test that is abnormal or we need to change your treatment, we will call you to review the results.   Testing/Procedures: None   Follow-Up: At [facility], you and your health needs are our priority.  As part of our continuing mission to provide you with exceptional heart care, we have created designated Provider Care Teams.  These Care Teams include your primary Cardiologist (physician) and Advanced Practice Providers (APPs -  Physician Assistants and Nurse Practitioners) who all work together to provide you with the care you need, when you need it.  We recommend signing up for the patient portal called "MyChart".  Sign up information is provided on this After Visit Summary.  MyChart is used to connect with patients for Virtual Visits (Telemedicine).  Patients are able to view lab/test results, encounter notes, upcoming appointments, etc.  Non-urgent messages can be sent to your provider as well.   To learn more about what you can do with MyChart, go to [email or web address].    Your next appointment:   6 month(s)  The format for your next appointment:   In Person  Provider:   Buford Dresser, MD       Signed, Buford Dresser, MD PhD 07/19/2019    [place]

## 2019-07-31 LAB — LIPID PANEL
Chol/HDL Ratio: 2.1 ratio (ref 0.0–5.0)
Cholesterol, Total: 150 mg/dL (ref 100–199)
HDL: 70 mg/dL (ref >39)
LDL Chol Calc (NIH): 69 mg/dL (ref 0–99)
Triglycerides: 53 mg/dL (ref 0–149)
VLDL Cholesterol Cal: 11 mg/dL (ref 5–40)

## 2019-07-31 LAB — LIPOPROTEIN A (LPA): Lipoprotein (a): 185.5 nmol/L — ABNORMAL HIGH (ref <75.0)

## 2019-10-05 ENCOUNTER — Encounter: Payer: Self-pay | Admitting: Cardiology

## 2020-01-18 ENCOUNTER — Other Ambulatory Visit: Payer: Self-pay

## 2020-01-18 ENCOUNTER — Ambulatory Visit: Payer: 59 | Admitting: Cardiology

## 2020-01-18 VITALS — BP 128/70 | HR 86 | Ht 72.0 in | Wt 209.0 lb

## 2020-01-18 DIAGNOSIS — E7800 Pure hypercholesterolemia, unspecified: Secondary | ICD-10-CM

## 2020-01-18 DIAGNOSIS — Z8249 Family history of ischemic heart disease and other diseases of the circulatory system: Secondary | ICD-10-CM

## 2020-01-18 DIAGNOSIS — I2510 Atherosclerotic heart disease of native coronary artery without angina pectoris: Secondary | ICD-10-CM

## 2020-01-18 DIAGNOSIS — I10 Essential (primary) hypertension: Secondary | ICD-10-CM | POA: Diagnosis not present

## 2020-01-18 MED ORDER — AMLODIPINE BESYLATE 5 MG PO TABS: 5.0000 mg | ORAL_TABLET | Freq: Every day | ORAL | 3 refills | Status: DC

## 2020-01-18 NOTE — Patient Instructions (Signed)

## 2020-01-18 NOTE — Progress Notes (Signed)
Cardiology Office Note:    Date:  01/18/2020   ID:  Tyler Smith, DOB 1960/03/29, MRN [id number]  PCP:  Asencion Gowda.August Saucer, MD  Cardiologist:  Jodelle Red, MD PhD  Referring MD: Asencion Gowda.August Saucer, MD   CC: follow up  History of Present Illness:    Tyler Smith is a 59 y.o. male with a hx of hypothyroidism, hyperlipidemia, family history of CAD who is seen in follow up today. He was initially seen as a new consult on 01/19/18 at the request of Clovis Riley, L.August Saucer, MD for the evaluation and management of cardiovascular risk and prevention.  Cardiac history: Two brothers who had CAD in their 34s and passed away. Lipid panel on pravastatin HDL 62, LDL 110, Tchol 190, TG 90. Negative ETT in 2013, records sent from [facility]. 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 Rare PVCs, otherwise normal.   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 Recheck lipids 04/14/18 Tchol 153, TG 92, HDL 57, LDL 78  Today: BP at home 536/64, averaging 120s/70s. Checks 1-2 times/week. Tolerating medications. Staying active, getting back to a routine after dealing with sciatic nerve issues which are improved.  Denies chest pain, shortness of breath at rest or with normal exertion. No PND, orthopnea, LE edema or unexpected weight gain. No syncope or palpitations.  Has had a lot of stress recently when his wife of 34 years left him unexpectedly several months ago. He is coping with this.  Past Medical History:  Diagnosis Date   GERD (gastroesophageal reflux disease)    none recent   Hypercholesterolemia    Hypothyroidism    Pancreatic cyst    Thyroid disease     Past Surgical History:  Procedure Laterality Date   ANTERIOR CERVICAL DECOMP/DISCECTOMY FUSION N/A 07/31/2016   Procedure: ANTERIOR CERVICAL  DECOMPRESSION/DISCECTOMY FUSION 3 LEVELS;  Surgeon: Estill Bamberg, MD;  [facility]: [facility];  Service: Orthopedics;  Laterality: N/A;   EUS N/A 09/01/2013   Procedure: ESOPHAGEAL ENDOSCOPIC ULTRASOUND (EUS) RADIAL;  Surgeon: Willis Modena, MD;  Location: [facility];  Service: Endoscopy;  Laterality: N/A;   EYE SURGERY Bilateral age 13   lazy eye   FINE NEEDLE ASPIRATION N/A 09/01/2013   Procedure: FINE NEEDLE ASPIRATION (FNA) LINEAR;  Surgeon: Willis Modena, MD;  Location: [facility];  Service: Endoscopy;  Laterality: N/A;   pinkie finger surgfery Right yrs ago    Current Medications: Current Outpatient Medications on File Prior to Visit  Medication Sig   amLODipine (NORVASC) 5 MG tablet TAKE 1 TABLET BY MOUTH EVERY DAY   aspirin EC 81 MG tablet Take 1 tablet (81 mg total) by mouth daily.   buPROPion (WELLBUTRIN XL) 300 MG 24 hr tablet Take 300 mg by mouth daily.   levothyroxine (SYNTHROID, LEVOTHROID) 150 MCG tablet Take 150 mcg by mouth daily.   lisinopril-hydrochlorothiazide (ZESTORETIC) 10-12.5 MG tablet Take 1 tablet by mouth daily.   rosuvastatin (CRESTOR) 20 MG tablet TAKE 1 TABLET BY MOUTH EVERY DAY   SYMBICORT 80-4.5 MCG/ACT inhaler Inhale 2 puffs into the lungs as needed.    Turmeric 500 MG CAPS Take 1 capsule by mouth daily.   No current facility-administered medications on file prior to visit.     Allergies:   Penicillins   Social History   Tobacco Use   Smoking status: Never Smoker  Smokeless tobacco: Never Used  Substance Use Topics   Alcohol use: Yes    Comment: occasional   Drug use: No    Family History: The patient's family history includes COPD in his father; Cancer - Other in his mother; Coronary artery disease in his brother and brother.  two brothers with multiple heart attacks, both deceased. One brother passed away from surgery complications, one passed from heart disease. Initial heart attacks in their mid 50s. Both smokers, both had been heavy  drinkers in the past. Mother had unknown heart disease, passed from another cause.Unsure of dad's history as he was not present. Also has 2 sisters in good health. Has a 64 year old daughter, in good health.  ROS:   Please see the history of present illness.  Additional pertinent ROS otherwise unremarkable.  EKGs/Labs/Other Studies Reviewed:    The following studies were reviewed today: Records from Dr. Clovis Riley, including prior ETT  EKG:  EKG is personally reviewed.  The ekg ordered 01/18/20 demonstrates normal sinus rhythm, nonspecific t waves at 86 bpm  Recent Labs: No results found for requested labs within last 8760 hours.  Recent Lipid Panel    Component Value Date/Time   CHOL 150 07/30/2019 0832   TRIG 53 07/30/2019 0832   HDL 70 07/30/2019 0832   CHOLHDL 2.1 07/30/2019 0832   LDLCALC 69 07/30/2019 0832    Physical Exam:    VS:  BP 128/70   Pulse 86   Ht 6' (1.829 m)   Wt 209 lb (94.8 kg)   SpO2 97%   BMI 28.35 kg/m     Wt Readings from Last 3 Encounters:  01/18/20 209 lb (94.8 kg)  07/19/19 210 lb (95.3 kg)  01/20/19 208 lb (94.3 kg)    GEN: Well nourished, well developed in no acute distress HEENT: Normal, moist mucous membranes NECK: No JVD CARDIAC: regular rhythm, normal S1 and S2, no rubs or gallops. No murmur. VASCULAR: Radial and DP pulses 2+ bilaterally. No carotid bruits RESPIRATORY:  Clear to auscultation without rales, wheezing or rhonchi  ABDOMEN: Soft, non-tender, non-distended MUSCULOSKELETAL:  Ambulates independently SKIN: Warm and dry, no edema NEUROLOGIC:  Alert and oriented x 3. No focal neuro deficits noted. PSYCHIATRIC:  Normal affect    ASSESSMENT:    1. Essential hypertension   2. Nonocclusive coronary atherosclerosis of native coronary artery   3. Pure hypercholesterolemia   4. Family history of heart disease    PLAN:    Coronary calcium on CT: this is consistent with nonobstructive CAD, though not highest risk group (which is  Ca score >400).  -on high intensity statin -has done an excellent job with lifestyle -asymptomatic even with significant exertion -treadmill stress test cancelled 04/2018 due to diastolic hypertension -tolerating aspirin 81 mg  hypercholesterolemia, FH of significant CAD, elevated Lp(a) lipids show LDL 69, TG 53, though lp(a) remains elevated at 185 -continue rosuvastatin 20 mg daily -if indications change in the future, consider PCSK9i for elevated lp(a) -continue excellent lifestyle -have discussed family screening previously  Hypertension: at goal of <130/80 today -currently on lisinopril-HCTZ 10-12.5 mg daily, amlodipine 5 mg daily -monitoring at home  Secondary prevention -recommend heart healthy/Mediterranean diet, with whole grains, fruits, vegetable, fish, lean meats, nuts, and olive oil. Limit salt. -recommend moderate walking, 3-5 times/week for 30-50 minutes each session. Aim for at least 150 minutes.week. Goal should be pace of 3 miles/hours, or walking 1.5 miles in 30 minutes -recommend avoidance of tobacco products. Avoid excess alcohol.  Plan for follow up: 1 year or sooner as needed  Jodelle Red, MD, PhD [place]  [facility]   Medication Adjustments/Labs and Tests Ordered: Current medicines are reviewed at length with the patient today.  Concerns regarding medicines are outlined above.  Orders Placed This Encounter  Procedures   EKG 12-Lead   Meds ordered this encounter  Medications   DISCONTD: amLODipine (NORVASC) 5 MG tablet    Sig: Take 1 tablet (5 mg total) by mouth daily.    Dispense:  90 tablet    Refill:  3    Patient Instructions  Medication Instructions:  Your Physician recommend you continue on your current medication as directed.    *If you need a refill on your cardiac medications before your next appointment, please call your pharmacy*   Lab Work: None   Testing/Procedures: None   Follow-Up: At [facility], you  and your health needs are our priority.  As part of our continuing mission to provide you with exceptional heart care, we have created designated Provider Care Teams.  These Care Teams include your primary Cardiologist (physician) and Advanced Practice Providers (APPs -  Physician Assistants and Nurse Practitioners) who all work together to provide you with the care you need, when you need it.  We recommend signing up for the patient portal called "MyChart".  Sign up information is provided on this After Visit Summary.  MyChart is used to connect with patients for Virtual Visits (Telemedicine).  Patients are able to view lab/test results, encounter notes, upcoming appointments, etc.  Non-urgent messages can be sent to your provider as well.   To learn more about what you can do with MyChart, go to [email or web address].    Your next appointment:   1 year(s)  The format for your next appointment:   In Person  Provider:   Jodelle Red, MD      Signed, Jodelle Red, MD PhD 01/18/2020    [facility]

## 2020-02-21 ENCOUNTER — Other Ambulatory Visit: Payer: Self-pay | Admitting: Cardiology

## 2020-04-20 MED ORDER — LOSARTAN POTASSIUM-HCTZ 50-12.5 MG PO TABS: 1.0000 | ORAL_TABLET | Freq: Every day | ORAL | 3 refills | Status: DC

## 2020-04-20 NOTE — Addendum Note (Signed)
Addended by: Parke Poisson on: 04/20/2020 01:03 PM   Modules accepted: Orders

## 2020-04-20 NOTE — Telephone Encounter (Signed)
Cheree Ditto, RPH  Freddi Starr, RN Yes, possible cough is from lisinopril. Recommend discontinuing lisinopril/hctz and switching to losartan/hctz 50/12.5mg  once daily in the morning and adding lisinopril to patient's intolerance list

## 2020-11-19 ENCOUNTER — Other Ambulatory Visit: Payer: Self-pay | Admitting: Cardiology

## 2020-11-20 NOTE — Telephone Encounter (Signed)
Rx(s) sent to pharmacy electronically.  

## 2020-12-24 ENCOUNTER — Encounter: Payer: Self-pay | Admitting: Cardiology

## 2021-01-18 ENCOUNTER — Other Ambulatory Visit: Payer: Self-pay | Admitting: Cardiology

## 2021-02-02 ENCOUNTER — Encounter (HOSPITAL_BASED_OUTPATIENT_CLINIC_OR_DEPARTMENT_OTHER): Payer: Self-pay

## 2021-02-28 ENCOUNTER — Other Ambulatory Visit: Payer: Self-pay | Admitting: Cardiology

## 2021-03-07 ENCOUNTER — Other Ambulatory Visit: Payer: Self-pay

## 2021-03-07 ENCOUNTER — Ambulatory Visit (HOSPITAL_BASED_OUTPATIENT_CLINIC_OR_DEPARTMENT_OTHER): Payer: 59 | Admitting: Cardiology

## 2021-03-07 ENCOUNTER — Encounter (HOSPITAL_BASED_OUTPATIENT_CLINIC_OR_DEPARTMENT_OTHER): Payer: Self-pay | Admitting: Cardiology

## 2021-03-07 VITALS — BP 120/88 | HR 91 | Ht 72.0 in | Wt 223.3 lb

## 2021-03-07 DIAGNOSIS — I2510 Atherosclerotic heart disease of native coronary artery without angina pectoris: Secondary | ICD-10-CM | POA: Diagnosis not present

## 2021-03-07 DIAGNOSIS — I10 Essential (primary) hypertension: Secondary | ICD-10-CM | POA: Diagnosis not present

## 2021-03-07 DIAGNOSIS — Z7189 Other specified counseling: Secondary | ICD-10-CM

## 2021-03-07 DIAGNOSIS — E7800 Pure hypercholesterolemia, unspecified: Secondary | ICD-10-CM | POA: Diagnosis not present

## 2021-03-07 DIAGNOSIS — Z8249 Family history of ischemic heart disease and other diseases of the circulatory system: Secondary | ICD-10-CM

## 2021-03-07 NOTE — Patient Instructions (Signed)

## 2021-03-07 NOTE — Progress Notes (Signed)
Cardiology Office Note:    Date:  03/07/2021   ID:  Tyler PesterJamie L Hallinan, DOB 03/12/1960, MRN [id number]  PCP:  Asencion GowdaMitchell, L.August Saucerean, MD  Cardiologist:  Jodelle RedBridgette Anton Cheramie, MD PhD  Referring MD: Asencion GowdaMitchell, L.August Saucerean, MD   CC: follow up  History of Present Illness:    Tyler Smith is a 61 y.o. male with a hx of hypothyroidism, hyperlipidemia, family history of CAD who is seen in follow up today. He was initially seen as a new consult on 01/19/18 at the request of Clovis RileyMitchell, L.August Saucerean, MD for the evaluation and management of cardiovascular risk and prevention.  Cardiac history: Two brothers who had CAD in their 8950s and passed away. Lipid panel on pravastatin HDL 62, LDL 110, Tchol 190, TG 90. Negative ETT in 2013, [facility]. 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 Rare PVCs, otherwise normal.   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 Recheck lipids 04/14/18 Tchol 153, TG 92, HDL 57, LDL 78  Today: Overall, he is feeling good today with no new cardiovascular complaints. He reports no new changes to his family history.  At home he monitors his blood pressure twice a week. His readings have averaged in the 120s/70s-80s.  Previously he tried running for exercise, but this was too painful for his lower extremities. Now he is completing a brisk 30 minute walk about 3-4 times a week. He denies experiencing any physical limitations or symptoms while exercising.  He denies any palpitations, chest pain, or shortness of breath. No lightheadedness, headaches, syncope, orthopnea, PND, or lower extremity edema.   Past Medical History:  Diagnosis Date   GERD (gastroesophageal reflux disease)    none recent   Hypercholesterolemia    Hypothyroidism    Pancreatic cyst    Thyroid disease     Past Surgical  History:  Procedure Laterality Date   ANTERIOR CERVICAL DECOMP/DISCECTOMY FUSION N/A 07/31/2016   Procedure: ANTERIOR CERVICAL DECOMPRESSION/DISCECTOMY FUSION 3 LEVELS;  Surgeon: Estill Bambergumonski, Mark, MD;  Location: [facility] OR;  Service: Orthopedics;  Laterality: N/A;   EUS N/A 09/01/2013   Procedure: ESOPHAGEAL ENDOSCOPIC ULTRASOUND (EUS) RADIAL;  Surgeon: Willis ModenaWilliam Outlaw, MD;  Location: [facility];  Service: Endoscopy;  Laterality: N/A;   EYE SURGERY Bilateral age 52   lazy eye   FINE NEEDLE ASPIRATION N/A 09/01/2013   Procedure: FINE NEEDLE ASPIRATION (FNA) LINEAR;  Surgeon: Willis ModenaWilliam Outlaw, MD;  Location: [facility];  Service: Endoscopy;  Laterality: N/A;   pinkie finger surgfery Right yrs ago    Current Medications: Current Outpatient Medications on File Prior to Visit  Medication Sig   amLODipine (NORVASC) 5 MG tablet Take 1 tablet (5 mg total) by mouth daily.   aspirin EC 81 MG tablet Take 1 tablet (81 mg total) by mouth daily.   buPROPion (WELLBUTRIN XL) 300 MG 24 hr tablet Take 300 mg by mouth daily.   escitalopram (LEXAPRO) 10 MG tablet Take 10 mg by mouth daily.   levothyroxine (SYNTHROID) 175 MCG tablet 1 tablet in the morning on an empty stomach   losartan-hydrochlorothiazide (HYZAAR) 50-12.5 MG tablet Take 1 tablet by mouth daily.   Multiple Vitamin (MULTI VITAMIN) TABS Take 1 tablet by mouth daily.   rosuvastatin (CRESTOR) 20 MG tablet TAKE 1 TABLET BY MOUTH EVERY DAY   No current facility-administered medications on file  prior to visit.     Allergies:   Penicillins   Social History   Tobacco Use   Smoking status: Never   Smokeless tobacco: Never  Substance Use Topics   Alcohol use: Yes    Comment: occasional   Drug use: No    Family History: The patient's family history includes COPD in his father; Cancer - Other in his mother; Coronary artery disease in his brother and brother.  two brothers with multiple heart attacks, both deceased. One brother passed away from surgery  complications, one passed from heart disease. Initial heart attacks in their mid 50s. Both smokers, both had been heavy drinkers in the past. Mother had unknown heart disease, passed from another cause.Unsure of dad's history as he was not present. Also has 2 sisters in good health. Has a 35 year old daughter, in good health.  ROS:   Please see the history of present illness.   Additional pertinent ROS otherwise unremarkable.  EKGs/Labs/Other Studies Reviewed:    The following studies were reviewed today: Records from Dr. Clovis Riley, including prior ETT  CT Calcium Score 02/17/2018: FINDINGS: Non-cardiac: See separate report from [facility].   Ascending aorta: Normal diameter 3.7 cm   Pericardium: Normal   Coronary arteries: 3 vessel coronary calcium noted in proximal and mid vessels   IMPRESSION: Coronary calcium score of 227. This was 52 th percentile for age and sex matched control.  EKG:  EKG is personally reviewed.   03/07/2021: NSR at 91 bpm 01/18/20: normal sinus rhythm, nonspecific t waves at 86 bpm  Recent Labs: No results found for requested labs within last 8760 hours.   Recent Lipid Panel    Component Value Date/Time   CHOL 150 07/30/2019 0832   TRIG 53 07/30/2019 0832   HDL 70 07/30/2019 0832   CHOLHDL 2.1 07/30/2019 0832   LDLCALC 69 07/30/2019 0832    Physical Exam:    VS:  BP 120/88 (BP Location: Left Arm, Patient Position: Sitting, Cuff Size: Large)    Pulse 91    Ht 6' (1.829 m)    Wt 223 lb 4.8 oz (101.3 kg)    BMI 30.28 kg/m     Wt Readings from Last 3 Encounters:  03/07/21 223 lb 4.8 oz (101.3 kg)  01/18/20 209 lb (94.8 kg)  07/19/19 210 lb (95.3 kg)    GEN: Well nourished, well developed in no acute distress HEENT: Normal, moist mucous membranes NECK: No JVD CARDIAC: regular rhythm, normal S1 and S2, no rubs or gallops. No murmur. VASCULAR: Radial and DP pulses 2+ bilaterally. No carotid bruits RESPIRATORY:  Clear to auscultation  without rales, wheezing or rhonchi  ABDOMEN: Soft, non-tender, non-distended MUSCULOSKELETAL:  Ambulates independently SKIN: Warm and dry, no edema NEUROLOGIC:  Alert and oriented x 3. No focal neuro deficits noted. PSYCHIATRIC:  Normal affect    ASSESSMENT:    1. Nonocclusive coronary atherosclerosis of native coronary artery   2. Essential hypertension   3. Family history of heart disease   4. Pure hypercholesterolemia   5. Cardiac risk counseling   6. Counseling on health promotion and disease prevention     PLAN:    Coronary calcium on CT: this is consistent with nonobstructive CAD, though not highest risk group (which is Ca score >400).  -on high intensity statin -has done an excellent job with lifestyle -asymptomatic even with significant exertion -treadmill stress test cancelled 04/2018 due to diastolic hypertension -tolerating aspirin 81 mg  hypercholesterolemia, FH of significant CAD,  elevated Lp(a) -lipids 01/24/21: Tchol 186, HDL 77, LDL 93, TG 89 -lp(a) elevated at 185 -continue rosuvastatin 20 mg daily -will reach out to Dr. Rennis Golden to see if he would be a candidate for an Lp(a) trial -continue excellent lifestyle -have discussed family screening previously  Hypertension:  goal of <130/80 -currently on lisinopril-HCTZ 10-12.5 mg daily, amlodipine 5 mg daily -monitoring at home -has issues with diastolic hypertension, slightly above goal today. Monitor.  Secondary prevention -recommend heart healthy/Mediterranean diet, with whole grains, fruits, vegetable, fish, lean meats, nuts, and olive oil. Limit salt. -recommend moderate walking, 3-5 times/week for 30-50 minutes each session. Aim for at least 150 minutes.week. Goal should be pace of 3 miles/hours, or walking 1.5 miles in 30 minutes -recommend avoidance of tobacco products. Avoid excess alcohol.  Plan for follow up: 1 year or sooner as needed  Jodelle Red, MD, PhD [place]   [facility]    Medication Adjustments/Labs and Tests Ordered: Current medicines are reviewed at length with the patient today.  Concerns regarding medicines are outlined above.   Orders Placed This Encounter  Procedures   EKG 12-Lead   No orders of the defined types were placed in this encounter.  Patient Instructions  Medication Instructions:  Your Physician recommend you continue on your current medication as directed.    *If you need a refill on your cardiac medications before your next appointment, please call your pharmacy*   Lab Work: None ordered today   Testing/Procedures: None ordered today   Follow-Up: At [facility], you and your health needs are our priority.  As part of our continuing mission to provide you with exceptional heart care, we have created designated Provider Care Teams.  These Care Teams include your primary Cardiologist (physician) and Advanced Practice Providers (APPs -  Physician Assistants and Nurse Practitioners) who all work together to provide you with the care you need, when you need it.  We recommend signing up for the patient portal called "MyChart".  Sign up information is provided on this After Visit Summary.  MyChart is used to connect with patients for Virtual Visits (Telemedicine).  Patients are able to view lab/test results, encounter notes, upcoming appointments, etc.  Non-urgent messages can be sent to your provider as well.   To learn more about what you can do with MyChart, go to [email or web address].    Your next appointment:   1 year(s)  The format for your next appointment:   In Person  Provider:   Jodelle Red, MD       [facility] Stumpf,acting as a scribe for Jodelle Red, MD.,have documented all relevant documentation on the behalf of Jodelle Red, MD,as directed by  Jodelle Red, MD while in the presence of Jodelle Red, MD.  I, Jodelle Red, MD, have reviewed all  documentation for this visit. The documentation on 03/07/21 for the exam, diagnosis, procedures, and orders are all accurate and complete.   Signed, Jodelle Red, MD PhD 03/07/2021    [facility]

## 2021-04-04 ENCOUNTER — Other Ambulatory Visit: Payer: Self-pay | Admitting: *Deleted

## 2021-04-04 MED ORDER — ROSUVASTATIN CALCIUM 20 MG PO TABS: 20.0000 mg | ORAL_TABLET | Freq: Every day | ORAL | 3 refills | Status: DC

## 2021-04-26 ENCOUNTER — Other Ambulatory Visit: Payer: Self-pay | Admitting: Cardiology

## 2021-04-26 NOTE — Telephone Encounter (Signed)
Rx(s) sent to pharmacy electronically.  

## 2021-07-06 ENCOUNTER — Other Ambulatory Visit: Payer: Self-pay | Admitting: Cardiology

## 2021-07-06 NOTE — Telephone Encounter (Signed)
Rx(s) sent to pharmacy electronically.  

## 2022-02-04 ENCOUNTER — Other Ambulatory Visit: Payer: Self-pay | Admitting: Cardiology

## 2022-02-05 NOTE — Telephone Encounter (Signed)
Rx(s) sent to pharmacy electronically.  

## 2022-03-10 ENCOUNTER — Other Ambulatory Visit: Payer: Self-pay | Admitting: Cardiology

## 2022-03-11 NOTE — Telephone Encounter (Signed)
Rx request sent to pharmacy.  

## 2022-04-27 ENCOUNTER — Other Ambulatory Visit: Payer: Self-pay | Admitting: Cardiology

## 2022-04-29 NOTE — Telephone Encounter (Signed)
Rx(s) sent to pharmacy electronically.  

## 2022-05-27 ENCOUNTER — Other Ambulatory Visit: Payer: Self-pay | Admitting: Cardiology

## 2022-05-27 NOTE — Telephone Encounter (Signed)
Rx(s) sent to pharmacy electronically.  

## 2022-07-01 ENCOUNTER — Other Ambulatory Visit: Payer: Self-pay | Admitting: Cardiology

## 2022-08-06 ENCOUNTER — Other Ambulatory Visit: Payer: Self-pay | Admitting: Cardiology

## 2022-08-06 NOTE — Telephone Encounter (Signed)
Rx request sent to pharmacy.  

## 2022-08-19 ENCOUNTER — Other Ambulatory Visit: Payer: Self-pay | Admitting: Cardiology

## 2022-08-19 NOTE — Telephone Encounter (Signed)
Rx request sent to pharmacy.  

## 2022-09-12 ENCOUNTER — Encounter (HOSPITAL_BASED_OUTPATIENT_CLINIC_OR_DEPARTMENT_OTHER): Payer: Self-pay | Admitting: Cardiology

## 2022-09-12 ENCOUNTER — Ambulatory Visit (HOSPITAL_BASED_OUTPATIENT_CLINIC_OR_DEPARTMENT_OTHER): Payer: No Typology Code available for payment source | Admitting: Cardiology

## 2022-09-12 VITALS — BP 132/84 | HR 86 | Ht 72.0 in | Wt 218.1 lb

## 2022-09-12 DIAGNOSIS — E7800 Pure hypercholesterolemia, unspecified: Secondary | ICD-10-CM

## 2022-09-12 DIAGNOSIS — Z8249 Family history of ischemic heart disease and other diseases of the circulatory system: Secondary | ICD-10-CM | POA: Diagnosis not present

## 2022-09-12 DIAGNOSIS — I10 Essential (primary) hypertension: Secondary | ICD-10-CM

## 2022-09-12 DIAGNOSIS — I2510 Atherosclerotic heart disease of native coronary artery without angina pectoris: Secondary | ICD-10-CM | POA: Diagnosis not present

## 2022-09-12 DIAGNOSIS — E7841 Elevated Lipoprotein(a): Secondary | ICD-10-CM

## 2022-09-12 DIAGNOSIS — Z7189 Other specified counseling: Secondary | ICD-10-CM

## 2022-09-12 NOTE — Progress Notes (Signed)
Cardiology Office Note:  .   Date:  09/12/2022  ID:  Tyler Smith, DOB 1960/12/26, MRN [id number] PCP: Asencion Gowda.August Saucer, MD  [facility] Providers Cardiologist:  Jodelle Red, MD {  History of Present Illness: .   Tyler Smith is a 62 y.o. male with a hx of hypothyroidism, hyperlipidemia, family history of CAD who is seen in follow up today. He was initially seen as a new consult on 01/19/18 at the request of Clovis Riley, L.August Saucer, MD for the evaluation and management of cardiovascular risk and prevention.   Cardiac history: Two brothers who had CAD in their 74s and passed away. Lipid panel on pravastatin HDL 62, LDL 110, Tchol 190, TG 90. Negative ETT in 2013, records sent from [facility]. 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 Rare PVCs, otherwise normal.    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 Recheck lipids 04/14/18 Tchol 153, TG 92, HDL 57, LDL 78  Lipids 02/01/22: Tchol 188, HDL 104, LDL 71, TG 68  Today: Playing pickleball twice a week, walks 4-5 miles/week.Eating well overall, avoids fatty foods. Has stable pulsatile tinnitus. Checks BP intermittently at home, usually 120s/70s.  ROS: Denies chest pain, shortness of breath at rest or with normal exertion. No PND, orthopnea, LE edema or unexpected weight gain. No syncope or palpitations. ROS otherwise negative except as noted.   Studies Reviewed: Marland Kitchen    EKG:  EKG Interpretation Date/Time:  Thursday September 12 2022 08:13:12 EDT Ventricular Rate:  86 PR Interval:  154 QRS Duration:  90 QT Interval:  380 QTC Calculation: 454 R Axis:   37  Text Interpretation: Normal sinus rhythm Normal ECG When compared with ECG of 26-Jul-2016 13:58, No significant change was found Confirmed by Jodelle Red [phone]) on 09/12/2022 8:29:59  AM    Physical Exam:   VS:  BP 132/84 (BP Location: Left Arm, Patient Position: Sitting, Cuff Size: Large)   Pulse 86   Ht 6' (1.829 m)   Wt 218 lb 1.6 oz (98.9 kg)   BMI 29.58 kg/m    Wt Readings from Last 3 Encounters:  09/12/22 218 lb 1.6 oz (98.9 kg)  03/07/21 223 lb 4.8 oz (101.3 kg)  01/18/20 209 lb (94.8 kg)    GEN: Well nourished, well developed in no acute distress HEENT: Normal, moist mucous membranes NECK: No JVD CARDIAC: regular rhythm, normal S1 and S2, no rubs or gallops. No murmur. VASCULAR: Radial and DP pulses 2+ bilaterally. No carotid bruits RESPIRATORY:  Clear to auscultation without rales, wheezing or rhonchi  ABDOMEN: Soft, non-tender, non-distended MUSCULOSKELETAL:  Ambulates independently SKIN: Warm and dry, no edema NEUROLOGIC:  Alert and oriented x 3. No focal neuro deficits noted. PSYCHIATRIC:  Normal affect    ASSESSMENT AND PLAN: .   Coronary calcium on CT: this is consistent with nonobstructive CAD, though not highest risk group (which is Ca score >400).  -on high intensity statin -has done an excellent job with lifestyle -asymptomatic even with significant exertion -treadmill stress test cancelled 04/2018 due to diastolic hypertension -tolerating aspirin 81 mg   hypercholesterolemia, FH of significant CAD, elevated Lp(a) -lp(a) elevated at 185 -continue rosuvastatin 20 mg daily -discussed PCSK9i for elevated lpa today. Will see if this is covered for primary prevention on his insurance. If so, will refer to pharmd  for teaching -continue excellent lifestyle -have discussed family screening previously   Hypertension:  goal of <130/80 -currently on lisinopril-HCTZ 10-12.5 mg daily, amlodipine 5 mg daily -monitoring at home   Secondary prevention -recommend heart healthy/Mediterranean diet, with whole grains, fruits, vegetable, fish, lean meats, nuts, and olive oil. Limit salt. -recommend moderate walking, 3-5 times/week for 30-50 minutes each  session. Aim for at least 150 minutes.week. Goal should be pace of 3 miles/hours, or walking 1.5 miles in 30 minutes -recommend avoidance of tobacco products. Avoid excess alcohol.  Dispo: 1 year  Signed, Jodelle Red, MD   Jodelle Red, MD, PhD, [facility]  [facility]  [place]  [facility] at [facility] at [facility] [address], [address] [place], [place] [address] [phone]

## 2022-09-12 NOTE — Patient Instructions (Signed)
Medication Instructions:  Your physician recommends that you continue on your current medications as directed. Please refer to the Current Medication list given to you today.  *If you need a refill on your cardiac medications before your next appointment, please call your pharmacy*  Lab Work: NONE  Testing/Procedures: NONE  Follow-Up: At [facility], you and your health needs are our priority.  As part of our continuing mission to provide you with exceptional heart care, we have created designated Provider Care Teams.  These Care Teams include your primary Cardiologist (physician) and Advanced Practice Providers (APPs -  Physician Assistants and Nurse Practitioners) who all work together to provide you with the care you need, when you need it.  We recommend signing up for the patient portal called "MyChart".  Sign up information is provided on this After Visit Summary.  MyChart is used to connect with patients for Virtual Visits (Telemedicine).  Patients are able to view lab/test results, encounter notes, upcoming appointments, etc.  Non-urgent messages can be sent to your provider as well.   To learn more about what you can do with MyChart, go to [email or web address].    Your next appointment:   12 month(s)  The format for your next appointment:   In Person  Provider:   Jodelle Red, MD or Ronn Melena NP   Will reach out to Pharm D team about [id number]

## 2022-10-30 ENCOUNTER — Other Ambulatory Visit: Payer: Self-pay | Admitting: Cardiology

## 2023-01-28 ENCOUNTER — Other Ambulatory Visit: Payer: Self-pay | Admitting: Cardiology

## 2023-10-31 ENCOUNTER — Other Ambulatory Visit: Payer: Self-pay | Admitting: Cardiology

## 2023-11-26 ENCOUNTER — Other Ambulatory Visit: Payer: Self-pay | Admitting: Cardiology

## 2024-01-14 ENCOUNTER — Other Ambulatory Visit: Payer: Self-pay | Admitting: Cardiology

## 2024-02-10 ENCOUNTER — Other Ambulatory Visit: Payer: Self-pay | Admitting: Cardiology
